# Patient Record
Sex: Male | Born: 1935 | Race: White | Hispanic: No | Smoking: Never smoker
Health system: Southern US, Community
[De-identification: ages and names within clinical notes are randomized; demographics above are authoritative.]

## PROBLEM LIST (undated history)

## (undated) DIAGNOSIS — R972 Elevated prostate specific antigen [PSA]: Secondary | ICD-10-CM

## (undated) DIAGNOSIS — Z87442 Personal history of urinary calculi: Secondary | ICD-10-CM

## (undated) DIAGNOSIS — L989 Disorder of the skin and subcutaneous tissue, unspecified: Secondary | ICD-10-CM

## (undated) DIAGNOSIS — Z860100 Personal history of colon polyps, unspecified: Secondary | ICD-10-CM

## (undated) DIAGNOSIS — I10 Essential (primary) hypertension: Secondary | ICD-10-CM

## (undated) HISTORY — DX: Disorder of the skin and subcutaneous tissue, unspecified: L98.9

## (undated) HISTORY — DX: Essential (primary) hypertension: I10

## (undated) HISTORY — DX: Personal history of urinary calculi: Z87.442

## (undated) HISTORY — DX: Personal history of colonic polyps: Z86.010

## (undated) HISTORY — DX: Personal history of colon polyps, unspecified: Z86.0100

## (undated) HISTORY — DX: Elevated prostate specific antigen (PSA): R97.20

## (undated) HISTORY — PX: COLONOSCOPY: SHX174

## (undated) HISTORY — PX: HERNIA REPAIR: SHX51

---

## 2009-04-28 ENCOUNTER — Ambulatory Visit: Payer: Self-pay | Admitting: Internal Medicine

## 2009-04-28 DIAGNOSIS — Z860100 Personal history of colon polyps, unspecified: Secondary | ICD-10-CM | POA: Insufficient documentation

## 2009-04-28 DIAGNOSIS — N400 Enlarged prostate without lower urinary tract symptoms: Secondary | ICD-10-CM | POA: Insufficient documentation

## 2009-04-28 DIAGNOSIS — Z87442 Personal history of urinary calculi: Secondary | ICD-10-CM | POA: Insufficient documentation

## 2009-04-28 DIAGNOSIS — I10 Essential (primary) hypertension: Secondary | ICD-10-CM | POA: Insufficient documentation

## 2009-04-28 LAB — CONVERTED CEMR LAB
Albumin: 4.4 g/dL (ref 3.5–5.2)
CO2: 28 meq/L (ref 19–32)
Chloride: 104 meq/L (ref 96–112)
Eosinophils Absolute: 0.1 10*3/uL (ref 0.0–0.7)
HDL: 39 mg/dL — ABNORMAL LOW (ref >39)
Hemoglobin: 17.2 g/dL — ABNORMAL HIGH (ref 13.0–17.0)
LDL Cholesterol: 93 mg/dL (ref 0–99)
Lymphs Abs: 2.2 10*3/uL (ref 0.7–4.0)
MCV: 88.9 fL (ref 78.0–100.0)
Monocytes Absolute: 0.6 10*3/uL (ref 0.1–1.0)
Monocytes Relative: 9 % (ref 3–12)
Neutro Abs: 4 10*3/uL (ref 1.7–7.7)
Neutrophils Relative %: 58 % (ref 43–77)
PSA: 4.01 ng/mL — ABNORMAL HIGH (ref 0.10–4.00)
RBC: 5.48 M/uL (ref 4.22–5.81)
Sodium: 139 meq/L (ref 135–145)
Total Bilirubin: 0.6 mg/dL (ref 0.3–1.2)
Total CHOL/HDL Ratio: 4.5
WBC: 7 10*3/uL (ref 4.0–10.5)

## 2009-04-30 ENCOUNTER — Telehealth: Payer: Self-pay | Admitting: Internal Medicine

## 2009-04-30 DIAGNOSIS — R972 Elevated prostate specific antigen [PSA]: Secondary | ICD-10-CM | POA: Insufficient documentation

## 2009-05-04 ENCOUNTER — Encounter: Payer: Self-pay | Admitting: Internal Medicine

## 2009-10-25 ENCOUNTER — Ambulatory Visit: Payer: Self-pay | Admitting: Internal Medicine

## 2009-10-25 DIAGNOSIS — L570 Actinic keratosis: Secondary | ICD-10-CM | POA: Insufficient documentation

## 2009-10-25 DIAGNOSIS — M19049 Primary osteoarthritis, unspecified hand: Secondary | ICD-10-CM | POA: Insufficient documentation

## 2009-10-29 ENCOUNTER — Telehealth: Payer: Self-pay | Admitting: Internal Medicine

## 2009-12-20 ENCOUNTER — Ambulatory Visit: Payer: Self-pay | Admitting: Family

## 2010-01-01 ENCOUNTER — Emergency Department (HOSPITAL_BASED_OUTPATIENT_CLINIC_OR_DEPARTMENT_OTHER): Admission: EM | Admit: 2010-01-01 | Discharge: 2010-01-02 | Payer: Self-pay | Admitting: Emergency Medicine

## 2010-03-09 ENCOUNTER — Ambulatory Visit
Admission: RE | Admit: 2010-03-09 | Discharge: 2010-03-09 | Payer: Self-pay | Source: Home / Self Care | Attending: Internal Medicine | Admitting: Internal Medicine

## 2010-03-09 DIAGNOSIS — R42 Dizziness and giddiness: Secondary | ICD-10-CM | POA: Insufficient documentation

## 2010-04-05 NOTE — Assessment & Plan Note (Signed)
Summary: knuckle hurts/dt   Vital Signs:  Patient profile:   77 year old male Height:      65 inches Weight:      152.25 pounds BMI:     25.43 O2 Sat:      96 % on Room air Temp:     98.1 degrees F oral Pulse rate:   65 / minute Pulse rhythm:   regular Resp:     18 per minute BP sitting:   140 / 80  (right arm) Cuff size:   regular  Vitals Entered By: Glendell Docker CMA (October 25, 2009 3:43 PM)  O2 Flow:  Room air CC: Left index finger knuckle pain Is Patient Diabetic? No Pain Assessment Patient in pain? yes     Location: lef indenx finger Intensity: 9 Type: sharp Onset of pain  Intermittent Comments c/o left index knuckle pain, ongoing several years patient states he has been taking 1/2 tablet of the Lisinopril for the last 8 months   Primary Care Provider:  Dondra Spry DO  CC:  Left index finger knuckle pain.  History of Present Illness:  Hypertension Follow-Up      This is a 77 year old man who presents for Hypertension follow-up.  The patient denies lightheadedness.  The patient denies the following associated symptoms: chest pain.  Compliance with medications (by patient report) has been near 100%.  The patient reports that dietary compliance has been fair.    reviewed prev lipid panel.  Triglycerides elevated he does not drink.  TSH was normal.    not following low cholesterol diet no regular exercise but he stays active.  he mows his lawn walking limited by low back pain  elevated PSA - seen by urologist who recommended ongoing surveillance.    Preventive Screening-Counseling & Management  Alcohol-Tobacco     Smoking Status: never  Allergies: No Known Drug Allergies  Past History:  Past Medical History: Hypertension Colonic polyps, hx of Nephrolithiasis, hx of  (lithotripsy)  Hx of elevated PSA hx of skin lesion right temple - Dr Craige Cotta  Past Surgical History: Cholecystectomy  2009 colonoscopy x 2 - initial showed polyps - f/u colonoscopy  was  Right inguinal hernia repair   PSH reviewed for relevance  Family History: Family History Breast cancer 1st degree relative <50 Family History of CAD Male 1st degree relative - died of MI at age 11 Family History Hypertension Father committed suicide when pt was 4 y/o    Social History: Retired - worked as Music therapist Widowed since 2005 2 daughters, 1 son  (son lives nearby) Never Smoked  Alcohol use-no   Physical Exam  General:  alert, well-developed, and well-nourished.   Neck:  supple, no masses, and no carotid bruits.   Lungs:  normal respiratory effort, normal breath sounds, no crackles, and no wheezes.   Heart:  normal rate, regular rhythm, and no gallop.   Msk:  left hand - OA changes 1 st MC joint, non tender Skin:  scaly area - right temple   Impression & Recommendations:  Problem # 1:  HYPERTENSION (ICD-401.9) Assessment Unchanged BP with manual cuff 132/78.  Maintain current medication regimen.  His updated medication list for this problem includes:    Lisinopril 10 Mg Tabs (Lisinopril) .Marland Kitchen... Take 1/2  tablet by mouth once a day  BP today: 140/80 Prior BP: 134/80 (04/28/2009)  Labs Reviewed: K+: 4.7 (04/28/2009) Creat: : 0.94 (04/28/2009)   Chol: 177 (04/28/2009)   HDL: 39 (04/28/2009)  LDL: 93 (04/28/2009)   TG: 226 (04/28/2009)  Problem # 2:  ACTINIC KERATOSIS (ICD-702.0) He is followed by Dr. Craige Cotta for premalignant lesions.  continue yearly follow up  Orders: Dermatology Referral (Derma)  Problem # 3:  DEGENERATIVE JOINT DISEASE, HANDS (ICD-715.94) pt worked as Music therapist.  he notes intermittent left hand knuckle pain.  he has been using otc aleve as needed.  pt advised to use sparingly we discussed risk of renal insuff. if pain gets worse, pt will consider referral to hand specialist for steroid injection  Problem # 4:  PROSTATE SPECIFIC ANTIGEN, ELEVATED (ICD-790.93) He is followed by urologist who monitors his PSA.    Complete  Medication List: 1)  Lisinopril 10 Mg Tabs (Lisinopril) .... Take 1/2  tablet by mouth once a day 2)  Omega-3 Fish Oil 1000 Mg Caps (Omega-3 fatty acids) .... Take 1 capsule by mouth once a day 3)  Multivitamins Tabs (Multiple vitamin) .... Take 1 tablet by mouth once a day  Patient Instructions: 1)  Please schedule a follow-up appointment in 6 months. 2)  BMP prior to visit, ICD-9: 401.9 3)  Lipid Panel prior to visit, ICD-9: 272.4 4)  TSH prior to visit, ICD-9: 272.4 5)  Please return for lab work one (1) week before your next appointment.    Immunization History:  Zostavax History:    Zostavax # 1:  zostavax (04/30/2009)

## 2010-04-05 NOTE — Progress Notes (Signed)
Summary: Health Questions  ---- Converted from flag ---- ---- 04/30/2009 9:27 AM, D. Thomos Lemons DO wrote: call pt - does he remember his last PSA result.  was he seen by urologist in the past. ------------------------------  Phone Note Outgoing Call   Call placed by: Glendell Docker CMA,  April 30, 2009 10:18 AM Call placed to: Patient Summary of Call: call placed to patient at [phone], no answer, detailed voice message left for patient to return call regarding health questions Initial call taken by: Glendell Docker CMA,  April 30, 2009 10:19 AM  Follow-up for Phone Call        pt. returned Darlene's phone call. I informed pt. Agustin Cree is at lunch and I would tell Darlene to call him back.Michaelle Copas  April 30, 2009 11:58 AM   Additional Follow-up for Phone Call Additional follow up Details #1::        call returned to patient at [phone], no answer, voice message left asking patient to return call regarding urology.  We need to know if he has seen a urologist in the past and if so which one has he seen, and if he would be willing to return to the urologist if he has been seen , if not he will need a referral to a new urologist due to a change in his PSA lab Additional Follow-up by: Glendell Docker CMA,  April 30, 2009 4:38 PM  New Problems: PROSTATE SPECIFIC ANTIGEN, ELEVATED (ICD-790.93)   Additional Follow-up for Phone Call Additional follow up Details #2::    attempted to contact patient at [phone], no answer voice message left for patient to return call Glendell Docker CMA,  May 03, 2009 9:08 AM  patient returned phone call and stated he see Dr Tommie Ard with [facility]. He states that he normally see him once a year. If he needs to follow up, he would like our office to arrange a appointment for him Follow-up by: Glendell Docker CMA,  May 03, 2009 11:46 AM  Additional Follow-up for Phone Call Additional follow up Details #3:: Details for  Additional Follow-up Action Taken: Appt  [facility]   Dr  Cleatrice Burke    sch'd  for March    24th Additional Follow-up by: Darral Dash,  May 05, 2009 12:05 PM  New Problems: PROSTATE SPECIFIC ANTIGEN, ELEVATED (ICD-790.93)

## 2010-04-05 NOTE — Assessment & Plan Note (Signed)
Summary: new to be est, fasting npx- jr   Vital Signs:  Patient profile:   77 year old male Height:      65 inches Weight:      160.75 pounds BMI:     26.85 O2 Sat:      97 % on Room air Temp:     97.7 degrees F oral Pulse rate:   63 / minute Pulse rhythm:   regular Resp:     18 per minute BP sitting:   134 / 80  (left arm) Cuff size:   large  Vitals Entered By: Glendell Docker CMA (April 28, 2009 9:40 AM)  O2 Flow:  Room air CC: New Patient    Primary Care Provider:  Dondra Spry DO  CC:  New Patient .  History of Present Illness: 77 y/o AA male to establish.  Pt has been taking htn meds 12-13 yrs.  BP always higher at doctors office.  usually 120's 130's systolic at home.   prev med caused dizziness.  he does not recall name.   still gets mild dizziness and fatigue no hx of CAD, CVA  He has hx of elevated PSA.       Preventive Screening-Counseling & Management  Alcohol-Tobacco     Alcohol drinks/day: 0     Smoking Status: never  Caffeine-Diet-Exercise     Caffeine use/day: None     Does Patient Exercise: yes     Type of exercise: walking     Times/week: 4  Allergies (verified): No Known Drug Allergies  Past History:  Past Medical History: Hypertension Colonic polyps, hx of Nephrolithiasis, hx of  (lithotripsy)   Past Surgical History: Cholecystectomy  2009 colonoscopy x 2 - initial showed polyps - f/u colonoscopy was  Right inguinal hernia repair   Family History: Family History Breast cancer 1st degree relative <50 Family History of CAD Male 1st degree relative - died of MI at age 39 Family History Hypertension Father committed suicide when pt was 4 y/o  Social History: Retired - worked as Music therapist Widowed since 2005 2 daughters, 1 son  (son lives nearby) Never Smoked Alcohol use-no Smoking Status:  never Caffeine use/day:  None Does Patient Exercise:  yes  Review of Systems  The patient denies anorexia, weight loss, weight  gain, chest pain, syncope, prolonged cough, abdominal pain, melena, hematochezia, severe indigestion/heartburn, difficulty walking, and depression.    Physical Exam  General:  alert, well-developed, and well-nourished.   Head:  normocephalic and atraumatic.   Eyes:  pupils equal, pupils round, and pupils reactive to light.   Ears:  R ear normal and L ear normal.   Mouth:  Oral mucosa and oropharynx without lesions or exudates.   Neck:  supple, no masses, and no carotid bruits.   Lungs:  normal respiratory effort, normal breath sounds, no crackles, and no wheezes.   Heart:  normal rate, regular rhythm, and no gallop.   Abdomen:  soft, non-tender, normal bowel sounds, and no masses.   Extremities:  No lower extremity edema  Neurologic:  cranial nerves II-XII intact and gait normal.   Psych:  normally interactive, good eye contact, not anxious appearing, and not depressed appearing.     Impression & Recommendations:  Problem # 1:  PREVENTIVE HEALTH CARE (ICD-V70.0)  Reviewed adult health maintenance protocols.   Flu Vax: Historical (11/30/2008)     Orders: EKG w/ Interpretation (93000)  Flu Vax: Historical (11/30/2008)     Problem # 2:  HYPERTENSION (  ICD-401.9) Continue lisinopril.  I encouraged low salt diet.  His updated medication list for this problem includes:    Lisinopril 10 Mg Tabs (Lisinopril) .Marland Kitchen... Take 1 tablet by mouth once a day  BP today: 134/80  Orders: T-Basic Metabolic Panel [phone]) T-Hepatic Function [phone]) T-Lipid Profile [phone]) T-CBC w/Diff ([phone])  Complete Medication List: 1)  Lisinopril 10 Mg Tabs (Lisinopril) .... Take 1 tablet by mouth once a day 2)  Omega-3 Fish Oil 1000 Mg Caps (Omega-3 fatty acids) .... Take 1 capsule by mouth once a day 3)  Multivitamins Tabs (Multiple vitamin) .... Take 1 tablet by mouth once a day 4)  Zostavax 95188 Unt/0.38ml Solr (Zoster vaccine live) .... Administer vaccine x 1  Other  Orders: T-PSA ([id number])  Patient Instructions: 1)  Please schedule a follow-up appointment in 6 months. Prescriptions: LISINOPRIL 10 MG TABS (LISINOPRIL) Take 1 tablet by mouth once a day  #90 x 1   Entered and Authorized by:   D. Thomos Lemons DO   Signed by:   D. Thomos Lemons DO on 04/28/2009   Method used:   Electronically to        [facility] # [phone]* (retail)       [address]       [place], [place]  [address]       Ph: [phone]       Fax: [phone]   RxID:   [id number] ZOSTAVAX 19400 UNT/0.65ML SOLR (ZOSTER VACCINE LIVE) administer vaccine x 1  #1 x 0   Entered and Authorized by:   D. Thomos Lemons DO   Signed by:   D. Thomos Lemons DO on 04/28/2009   Method used:   Print then Give to Patient   RxID:   [id number]    Immunization History:  Influenza Immunization History:    Influenza:  historical (11/30/2008)   Current Allergies (reviewed today): No known allergies

## 2010-04-05 NOTE — Letter (Signed)
   [place] at [facility] [address] [place], [place]  [address]  [place] Phone: [phone]      May 06, 2009   Mylo Choi [address] [place], [place] [address]  RE:  LAB RESULTS  Dear  Mr. SAVOCA,  The following is an interpretation of your most recent lab tests.  Please take note of any instructions provided or changes to medications that have resulted from your lab work.  PSA:  high - further testing needed PSA: 4.01  ELECTROLYTES:  Good - no changes needed  KIDNEY FUNCTION TESTS:  Good - no changes needed  LIVER FUNCTION TESTS:  Good - no changes needed  LIPID PANEL:  Fair - review at your next visit Triglyceride: 226   Cholesterol: 177   LDL: 93   HDL: 39   Chol/HDL%:  4.5 Ratio   Our office will contact you regarding referral to urologist.     Sincerely Yours,    Dr.  Thomos Lemons

## 2010-04-05 NOTE — Progress Notes (Signed)
Summary: insurance requires referral   Phone Note From Other Clinic   Caller: [facility] Call For: yoo Summary of Call: This patient's insurance requires a referral.  Please fax to [phone].  Lesion removal and path report  Initial call taken by: Roselle Locus,  October 29, 2009 9:07 AM  Follow-up for Phone Call        see order Follow-up by: D. Thomos Lemons DO,  October 29, 2009 5:09 PM  Additional Follow-up for Phone Call Additional follow up Details #1::        order printed and faxed Roselle Locus  November 01, 2009 7:50 AM

## 2010-04-05 NOTE — Assessment & Plan Note (Signed)
Summary: FLU INJECTION/HEA  Nurse Visit   Vital Signs:  Patient profile:   77 year old male Temp:     97.8 degrees F oral  Vitals Entered By: Mervin Kung CMA Duncan Dull) (December 20, 2009 3:13 PM)  Allergies: No Known Drug Allergies  Orders Added: 1)  Admin 1st Vaccine [90471] 2)  Flu Vaccine 3yrs + [78295]  Flu Vaccine Consent Questions     Do you have a history of severe allergic reactions to this vaccine? no    Any prior history of allergic reactions to egg and/or gelatin? no    Do you have a sensitivity to the preservative Thimersol? no    Do you have a past history of Guillan-Barre Syndrome? no    Do you currently have an acute febrile illness? no    Have you ever had a severe reaction to latex? no    Vaccine information given and explained to patient? yes    Are you currently pregnant? no    Lot Number:[id number]   Exp Date:09/03/2010   Site Given  Left Deltoid IM. Nicki Guadalajara Fergerson CMA Duncan Dull)  December 20, 2009 3:18 PM

## 2010-04-07 NOTE — Assessment & Plan Note (Signed)
Summary: ear ringing dizzy/mhf   Vital Signs:  Patient profile:   77 year old Schultz Height:      65 inches Weight:      153.50 pounds BMI:     25.64 O2 Sat:      98 % on Room air Temp:     97.8 degrees F oral Pulse rate:   65 / minute Resp:     18 per minute BP sitting:   122 / 80  (left arm) Cuff size:   large  Vitals Entered By: Glendell Docker CMA (March 09, 2010 10:22 AM)  O2 Flow:  Room air CC: Ringing in Ears Is Patient Diabetic? No Pain Assessment Patient in pain? no      Comments onset of dizziness about a month ago, was seen in ER, he states that he was suspending his head due to neck problems, stopped about a year ago, and had restarted. Patient was given a rx that he had filled, but has not taken the medication   Primary Care Provider:  Dondra Spry DO  CC:  Ringing in Ears.  History of Present Illness: 77 y/o white Schultz for follow up seen in ER in Oct for vertigo no vomiting heard a "roaring" in his ears (bilateral) no headaches restarted cervical traction which seems to be helping  he has noticed symptoms can start with changes in head position   Preventive Screening-Counseling & Management  Alcohol-Tobacco     Smoking Status: never  Allergies (verified): No Known Drug Allergies  Past History:  Past Medical History: Hypertension Colonic polyps, hx of Nephrolithiasis, hx of  (lithotripsy)  Hx of elevated PSA hx of skin lesion right temple - Dr Craige Cotta   Past Surgical History: Cholecystectomy  2009 colonoscopy x 2 - initial showed polyps - f/u colonoscopy was  Right inguinal hernia repair     Family History: Family History Breast cancer 1st degree relative <50 Family History of CAD Schultz 1st degree relative - died of MI at age 67 Family History Hypertension Father committed suicide when pt was 4 y/o     Social History: Retired - worked as Music therapist Widowed since 2005 2 daughters, 1 son  (son lives nearby) Never Smoked  Alcohol  use-no     Review of Systems  The patient denies vision loss and syncope.    Physical Exam  General:  alert, well-developed, and well-nourished.   Head:  normocephalic and atraumatic.   Eyes:  pupils equal, pupils round, and pupils reactive to light.  no nystagmus Ears:  R ear normal and L ear normal.   Mouth:  pharynx pink and moist.   Neck:  supple, no masses, and no carotid bruits.   Lungs:  normal respiratory effort, normal breath sounds, no crackles, and no wheezes.   Heart:  normal rate, regular rhythm, and no gallop.   Neurologic:  cranial nerves II-XII intact, gait normal, DTRs symmetrical and normal, finger-to-nose normal, and Romberg negative.   Psych:  normally interactive, good eye contact, not anxious appearing, and not depressed appearing.     Impression & Recommendations:  Problem # 1:  INTERMITTENT VERTIGO (ICD-780.4) pt suffered episode of vertigo in Oct of unclear etiology neuro exam is normal probable labrinthitis vs BPV vs low bp  monitor for now  Problem # 2:  HYPERTENSION (ICD-401.9) Assessment: Unchanged pt to monitor BP at home.  if any dizziness with standing, pt advised to take 1/2 of lisinopril dose no orthostasis today  His updated  medication list for this problem includes:    Lisinopril 10 Mg Tabs (Lisinopril) .Marland Kitchen... Take 1  tablet by mouth once a day  BP today: 122/80 Prior BP: 140/80 (10/25/2009)  Labs Reviewed: K+: 4.7 (04/28/2009) Creat: : 0.94 (04/28/2009)   Chol: 177 (04/28/2009)   HDL: 39 (04/28/2009)   LDL: 93 (04/28/2009)   TG: 226 (04/28/2009)  Complete Medication List: 1)  Lisinopril 10 Mg Tabs (Lisinopril) .... Take 1  tablet by mouth once a day 2)  Omega-3 Fish Oil 1000 Mg Caps (Omega-3 fatty acids) .... Take 1 capsule by mouth once a day 3)  Multivitamins Tabs (Multiple vitamin) .... Take 1 tablet by mouth once a day  Patient Instructions: 1)  Please schedule a follow-up appointment in 6 months CPX. 2)  BMP prior to visit,  ICD-9: 401.9 3)  Lipid Panel prior to visit, ICD-9 401.9 4)  High sensitivity CRP :  401.9 5)  Please return for lab work one (1) week before your next appointment.  Prescriptions: LISINOPRIL 10 MG TABS (LISINOPRIL) Take 1  tablet by mouth once a day  #90 x 1   Entered and Authorized by:   D. Thomos Lemons DO   Signed by:   D. Thomos Lemons DO on 03/09/2010   Method used:   Electronically to        [facility] # [phone]* (retail)       [address]       [place], [place]  [address]       Ph: [phone]       Fax: [phone]   RxID:   [id number]    Orders Added: 1)  Est. Patient Level IV [53664]    Current Allergies (reviewed today): No known allergies    Preventive Care Screening  Colonoscopy:    Date:  04/19/2005    Next Due:  04/2010    Results:  normal

## 2010-04-27 ENCOUNTER — Telehealth: Payer: Self-pay | Admitting: Internal Medicine

## 2010-04-28 ENCOUNTER — Ambulatory Visit: Payer: Self-pay | Admitting: Internal Medicine

## 2010-05-03 NOTE — Progress Notes (Signed)
Summary: please schedule lab work prior to cpe  Phone Note Call from Patient   Caller: Patient Call For: nurse Summary of Call: pt called in and made cpe appt for 7.11.12 at 8:30. per emr instructions, please schedule lab work prior bmp 401.9, lipid panel 401.9, high sensitivity CRP 401.9. Initial call taken by: Elba Barman,  April 27, 2010 1:57 PM  Follow-up for Phone Call        blood work entered for [facility] for the first week in July Follow-up by: Glendell Docker CMA,  April 28, 2010 8:45 AM

## 2010-05-18 LAB — BASIC METABOLIC PANEL
GFR calc Af Amer: >60 mL/min (ref >60)
GFR calc non Af Amer: >60 mL/min (ref >60)
Potassium: 4.1 mEq/L (ref 3.5–5.1)
Sodium: 144 mEq/L (ref 135–145)

## 2010-05-18 LAB — DIFFERENTIAL
Basophils Absolute: 0 10*3/uL (ref 0.0–0.1)
Lymphocytes Relative: 31 % (ref 12–46)
Monocytes Absolute: 0.8 10*3/uL (ref 0.1–1.0)
Monocytes Relative: 11 % (ref 3–12)
Neutro Abs: 3.8 10*3/uL (ref 1.7–7.7)

## 2010-05-18 LAB — CBC
HCT: 47.9 % (ref 39.0–52.0)
Hemoglobin: 16.3 g/dL (ref 13.0–17.0)
RDW: 12.9 % (ref 11.5–15.5)
WBC: 6.9 10*3/uL (ref 4.0–10.5)

## 2010-08-26 ENCOUNTER — Encounter: Payer: Self-pay | Admitting: Internal Medicine

## 2010-08-29 ENCOUNTER — Ambulatory Visit: Payer: Self-pay | Admitting: Internal Medicine

## 2010-09-08 ENCOUNTER — Encounter: Payer: Self-pay | Admitting: Internal Medicine

## 2010-09-08 ENCOUNTER — Ambulatory Visit (INDEPENDENT_AMBULATORY_CARE_PROVIDER_SITE_OTHER): Payer: No Typology Code available for payment source | Admitting: Internal Medicine

## 2010-09-08 DIAGNOSIS — Z79899 Other long term (current) drug therapy: Secondary | ICD-10-CM

## 2010-09-08 DIAGNOSIS — Z0000 Encounter for general adult medical examination without abnormal findings: Secondary | ICD-10-CM

## 2010-09-08 DIAGNOSIS — I10 Essential (primary) hypertension: Secondary | ICD-10-CM

## 2010-09-08 DIAGNOSIS — Z13220 Encounter for screening for lipoid disorders: Secondary | ICD-10-CM

## 2010-09-08 MED ORDER — LISINOPRIL 10 MG PO TABS: 10.0000 mg | ORAL_TABLET | Freq: Every day | ORAL | Status: DC

## 2010-09-10 DIAGNOSIS — Z0000 Encounter for general adult medical examination without abnormal findings: Secondary | ICD-10-CM | POA: Insufficient documentation

## 2010-09-10 NOTE — Assessment & Plan Note (Signed)
Nl exam. Obtain cbc, chem7, flp and psa. Pt will check home records for last pneumovax and tetanus.

## 2010-09-10 NOTE — Progress Notes (Signed)
  Subjective:    Patient ID: Derrick Schultz, male    DOB: 02-06-1931, 77 y.o.   MRN: [id number]  HPI Pt presents to clinic for annual exam. H/o minimally elevated psa reportedly s/p urology evaluation with no recent followup. Denies difficult urination or change in urination. Pt unsure of last pneumovax and tetanus but believes has records at home. BP reviewed as nl. Tolerates ace inhibitor without cough or angioedema. Colonoscopy utd. No active complaint.  Reviewed pmh, medications and allergies.    Review of Systems  Constitutional: Negative for fever, chills and fatigue.  Respiratory: Negative for cough and shortness of breath.   Cardiovascular: Negative for chest pain and palpitations.  Genitourinary: Negative for dysuria, hematuria and difficulty urinating.  Neurological: Negative for syncope and weakness.  Hematological: Negative for adenopathy. Does not bruise/bleed easily.  All other systems reviewed and are negative.       Objective:   Physical Exam  Nursing note and vitals reviewed. Constitutional: He appears well-developed and well-nourished. No distress.  HENT:  Head: Normocephalic and atraumatic.  Right Ear: Tympanic membrane, external ear and ear canal normal.  Left Ear: Tympanic membrane, external ear and ear canal normal.  Nose: Nose normal.  Mouth/Throat: Oropharynx is clear and moist. No oropharyngeal exudate.  Eyes: Conjunctivae and EOM are normal. Pupils are equal, round, and reactive to light. Right eye exhibits no discharge. Left eye exhibits no discharge. No scleral icterus.  Neck: Neck supple. No JVD present. Carotid bruit is not present.  Cardiovascular: Normal rate, regular rhythm and normal heart sounds.  Exam reveals no gallop and no friction rub.   No murmur heard. Pulmonary/Chest: Effort normal and breath sounds normal. No respiratory distress. He has no wheezes. He has no rales.  Abdominal: Soft. Bowel sounds are normal. He exhibits no distension  and no mass. There is no tenderness. There is no rebound and no guarding. Hernia confirmed negative in the right inguinal area and confirmed negative in the left inguinal area.  Genitourinary: Testes normal and penis normal. Right testis shows no mass. Left testis shows no mass.  Neurological: He is alert.  Skin: Skin is warm and dry. He is not diaphoretic. No erythema.  Psychiatric: He has a normal mood and affect.          Assessment & Plan:

## 2010-09-12 ENCOUNTER — Telehealth: Payer: Self-pay | Admitting: *Deleted

## 2010-09-12 DIAGNOSIS — Z79899 Other long term (current) drug therapy: Secondary | ICD-10-CM

## 2010-09-12 DIAGNOSIS — I10 Essential (primary) hypertension: Secondary | ICD-10-CM

## 2010-09-12 DIAGNOSIS — Z13220 Encounter for screening for lipoid disorders: Secondary | ICD-10-CM

## 2010-09-12 LAB — CBC WITH DIFFERENTIAL/PLATELET
HCT: 49.3 % (ref 39.0–52.0)
Hemoglobin: 17.2 g/dL — ABNORMAL HIGH (ref 13.0–17.0)
Lymphocytes Relative: 26 % (ref 12–46)
Lymphs Abs: 1.6 10*3/uL (ref 0.7–4.0)
Monocytes Relative: 8 % (ref 3–12)
Neutro Abs: 4.2 10*3/uL (ref 1.7–7.7)
Neutrophils Relative %: 64 % (ref 43–77)
RBC: 5.48 MIL/uL (ref 4.22–5.81)

## 2010-09-12 LAB — BASIC METABOLIC PANEL
BUN: 15 mg/dL (ref 6–23)
CO2: 24 mEq/L (ref 19–32)
Calcium: 10 mg/dL (ref 8.4–10.5)
Chloride: 106 mEq/L (ref 96–112)
Creat: 0.93 mg/dL (ref 0.50–1.35)

## 2010-09-12 LAB — TSH: TSH: 4.422 u[IU]/mL (ref 0.350–4.500)

## 2010-09-12 LAB — LIPID PANEL: LDL Cholesterol: 88 mg/dL (ref 0–99)

## 2010-09-12 NOTE — Telephone Encounter (Signed)
Received call from Katrina needing lab order for bloodwork drawn from pt today. Orders entered and forwarded to the lab per order of 09/08/10.

## 2010-09-14 ENCOUNTER — Encounter: Payer: No Typology Code available for payment source | Admitting: Internal Medicine

## 2010-12-05 ENCOUNTER — Encounter: Payer: Self-pay | Admitting: Internal Medicine

## 2010-12-05 ENCOUNTER — Ambulatory Visit (INDEPENDENT_AMBULATORY_CARE_PROVIDER_SITE_OTHER): Payer: No Typology Code available for payment source | Admitting: Internal Medicine

## 2010-12-05 VITALS — BP 114/64 | HR 60 | Temp 97.8°F | Resp 18 | Ht 66.0 in | Wt 153.0 lb

## 2010-12-05 DIAGNOSIS — L989 Disorder of the skin and subcutaneous tissue, unspecified: Secondary | ICD-10-CM | POA: Insufficient documentation

## 2010-12-05 DIAGNOSIS — Z23 Encounter for immunization: Secondary | ICD-10-CM

## 2010-12-05 NOTE — Assessment & Plan Note (Signed)
Recommend dermatology evaluation given length of time without healing. Consult appt will be made

## 2010-12-05 NOTE — Progress Notes (Signed)
  Subjective:    Patient ID: Derrick Schultz, male    DOB: 1930/08/19, 77 y.o.   MRN: [id number]  HPI Pt presents to clinic for evaluation of non healing skin lesion. Notes small sore on left lower temple that has not healed for at least 2 months. The area has been red without drainage. Has attempted steroid cream without improvement. Reports having seen dermatology ~ q year in the past for surveillance as well as removal of lesions. No other alleviating or exacerbating factors. No other complaints.  Past Medical History  Diagnosis Date  . Hypertension   . History of colonic polyps   . History of nephrolithiasis     lithotripsy  . Elevated PSA     history of elevated   . Skin lesion     of right templs- Dr. Craige Cotta   Past Surgical History  Procedure Date  . Cholecystectomy 2009  . Colonoscopy     x 2 intial showed polyps  . Hernia repair     right inguinal    reports that he has never smoked. He has never used smokeless tobacco. He reports that he does not drink alcohol or use illicit drugs. family history includes Breast cancer in an unspecified family member; Coronary artery disease in an unspecified family member; Hypertension in an unspecified family member; and Suicidality in his father. No Known Allergies     Review of Systems see hpi     Objective:   Physical Exam  Nursing note and vitals reviewed. Constitutional: He appears well-developed and well-nourished. No distress.  HENT:  Head: Normocephalic and atraumatic.  Right Ear: External ear normal.  Left Ear: External ear normal.  Eyes: Conjunctivae are normal. No scleral icterus.  Neurological: He is alert.  Skin: Skin is warm and dry. No rash noted. He is not diaphoretic. There is erythema. No pallor.  Psychiatric: He has a normal mood and affect.       Left temple region of face: small ?SK (~1/2 cm slightly raised hyperpigmented skin lesion). Anterior and inferior to ?SK is small ~1/2cm shallow sore +slight  erythema but no drainage or discharge.          Assessment & Plan:

## 2010-12-19 ENCOUNTER — Telehealth: Payer: Self-pay | Admitting: Internal Medicine

## 2010-12-19 MED ORDER — LISINOPRIL 10 MG PO TABS: 10.0000 mg | ORAL_TABLET | Freq: Every day | ORAL | Status: DC

## 2010-12-19 NOTE — Telephone Encounter (Signed)
Refill- lisinopril 10mg  tab. Take one tablet by mouth every day. Qty 90. Last fill 10.10.12

## 2010-12-19 NOTE — Telephone Encounter (Signed)
Rx refill sent to pharmacy. 

## 2011-03-21 ENCOUNTER — Telehealth: Payer: Self-pay | Admitting: Internal Medicine

## 2011-03-21 MED ORDER — LISINOPRIL 10 MG PO TABS: 10.0000 mg | ORAL_TABLET | Freq: Every day | ORAL | Status: DC

## 2011-03-21 NOTE — Telephone Encounter (Signed)
Rx refill sent to pharmacy. 

## 2011-09-08 ENCOUNTER — Encounter: Payer: No Typology Code available for payment source | Admitting: Internal Medicine

## 2011-09-14 ENCOUNTER — Encounter: Payer: Self-pay | Admitting: Internal Medicine

## 2011-09-14 ENCOUNTER — Ambulatory Visit (INDEPENDENT_AMBULATORY_CARE_PROVIDER_SITE_OTHER): Payer: Medicare Other | Admitting: Internal Medicine

## 2011-09-14 VITALS — BP 122/70 | HR 90 | Temp 97.9°F | Resp 16 | Ht 66.0 in | Wt 149.1 lb

## 2011-09-14 DIAGNOSIS — Z0000 Encounter for general adult medical examination without abnormal findings: Secondary | ICD-10-CM

## 2011-09-14 LAB — HEPATIC FUNCTION PANEL
ALT: 12 U/L (ref 0–53)
Albumin: 4.1 g/dL (ref 3.5–5.2)
Indirect Bilirubin: 0.6 mg/dL (ref 0.0–0.9)
Total Protein: 7.2 g/dL (ref 6.0–8.3)

## 2011-09-14 LAB — LIPID PANEL
Cholesterol: 186 mg/dL (ref 0–200)
HDL: 36 mg/dL — ABNORMAL LOW (ref >39)
Total CHOL/HDL Ratio: 5.2 Ratio
Triglycerides: 157 mg/dL — ABNORMAL HIGH (ref <150)
VLDL: 31 mg/dL (ref 0–40)

## 2011-09-14 LAB — CBC WITH DIFFERENTIAL/PLATELET
Basophils Absolute: 0 10*3/uL (ref 0.0–0.1)
Basophils Relative: 1 % (ref 0–1)
Hemoglobin: 16.9 g/dL (ref 13.0–17.0)
Lymphocytes Relative: 34 % (ref 12–46)
MCHC: 36.2 g/dL — ABNORMAL HIGH (ref 30.0–36.0)
Neutro Abs: 3.5 10*3/uL (ref 1.7–7.7)
Neutrophils Relative %: 53 % (ref 43–77)
RDW: 13.3 % (ref 11.5–15.5)
WBC: 6.6 10*3/uL (ref 4.0–10.5)

## 2011-09-14 LAB — BASIC METABOLIC PANEL
Potassium: 5 mEq/L (ref 3.5–5.3)
Sodium: 138 mEq/L (ref 135–145)

## 2011-09-14 LAB — TSH: TSH: 3.657 u[IU]/mL (ref 0.350–4.500)

## 2011-09-14 NOTE — Progress Notes (Signed)
  Subjective:    Patient ID: Derrick Schultz, male    DOB: 09-23-1930, 78 y.o.   MRN: [id number]  HPI Pt presents to clinic for annual physical. No active complaints. Scheduled to see urology and GI in near future.   Past Medical History  Diagnosis Date  . Hypertension   . History of colonic polyps   . History of nephrolithiasis     lithotripsy  . Elevated PSA     history of elevated   . Skin lesion     of right templs- Dr. Craige Cotta   Past Surgical History  Procedure Date  . Cholecystectomy 2009  . Colonoscopy     x 2 intial showed polyps  . Hernia repair     right inguinal    reports that he has never smoked. He has never used smokeless tobacco. He reports that he does not drink alcohol or use illicit drugs. family history includes Alcohol abuse in his father; Angina in his mother; Breast cancer in an unspecified family member; Cancer in his maternal aunt; Coronary artery disease in an unspecified family member; Hypertension in an unspecified family member; and Suicidality in his father. No Known Allergies   Review of Systems see hpi     Objective:   Physical Exam  Physical Exam  Nursing note and vitals reviewed. Constitutional: He appears well-developed and well-nourished. No distress.  HENT:  Head: Normocephalic and atraumatic.  Right Ear: Tympanic membrane and external ear normal.  Left Ear: Tympanic membrane and external ear normal.  Nose: Nose normal.  Mouth/Throat: Uvula is midline, oropharynx is clear and moist and mucous membranes are normal. No oropharyngeal exudate.  Eyes: Conjunctivae and EOM are normal. Pupils are equal, round, and reactive to light. Right eye exhibits no discharge. Left eye exhibits no discharge. No scleral icterus.  Neck: Neck supple. Carotid bruit is not present. No thyromegaly present.  Cardiovascular: Normal rate, regular rhythm and normal heart sounds.  Exam reveals no gallop and no friction rub.   No murmur heard. Pulmonary/Chest:  Effort normal and breath sounds normal. No respiratory distress. He has no wheezes. He has no rales.  Abdominal: Soft. He exhibits no distension and no mass. There is no hepatosplenomegaly. There is no tenderness. There is no rebound. Hernia confirmed negative in the right inguinal area and confirmed negative in the left inguinal area.  Genitourinary:  testes normal.Right testis shows no mass, no swelling and no tenderness. Right testis is descended. Left testis shows no mass, no swelling and no tenderness. Left testis is descended.  Lymphadenopathy:    He has no cervical adenopathy.       Right: No inguinal adenopathy present.       Left: No inguinal adenopathy present.  Neurological: He is alert.  Skin: Skin is warm and dry. He is not diaphoretic.  Psychiatric: He has a normal mood and affect.        Assessment & Plan:

## 2011-09-14 NOTE — Assessment & Plan Note (Signed)
Nl exam. Obtain cpe labs. psa and dre per urology. EKG demonstrates SB 58 with nl intervals. Possible rightward axis. Acute ischemic changes

## 2011-09-15 LAB — URINALYSIS, ROUTINE W REFLEX MICROSCOPIC
Glucose, UA: NEGATIVE mg/dL
Hgb urine dipstick: NEGATIVE
Leukocytes, UA: NEGATIVE
Protein, ur: NEGATIVE mg/dL

## 2011-09-19 ENCOUNTER — Telehealth: Payer: Self-pay | Admitting: Internal Medicine

## 2011-09-19 MED ORDER — LISINOPRIL 10 MG PO TABS: 10.0000 mg | ORAL_TABLET | Freq: Every day | ORAL | Status: DC

## 2011-09-19 NOTE — Telephone Encounter (Signed)
Done

## 2012-03-21 ENCOUNTER — Other Ambulatory Visit: Payer: Self-pay | Admitting: *Deleted

## 2012-03-21 MED ORDER — LISINOPRIL 10 MG PO TABS: 10.0000 mg | ORAL_TABLET | Freq: Every day | ORAL | Status: DC

## 2012-03-21 NOTE — Progress Notes (Signed)
Per patient request, Rx to pharmacy/SLS

## 2012-07-04 ENCOUNTER — Emergency Department (HOSPITAL_BASED_OUTPATIENT_CLINIC_OR_DEPARTMENT_OTHER)
Admission: EM | Admit: 2012-07-04 | Discharge: 2012-07-04 | Disposition: A | Discharge status: Home / Self Care | Payer: Medicare Other | Attending: Emergency Medicine | Admitting: Emergency Medicine

## 2012-07-04 ENCOUNTER — Encounter (HOSPITAL_BASED_OUTPATIENT_CLINIC_OR_DEPARTMENT_OTHER): Payer: Self-pay

## 2012-07-04 DIAGNOSIS — I10 Essential (primary) hypertension: Secondary | ICD-10-CM | POA: Insufficient documentation

## 2012-07-04 DIAGNOSIS — S30860A Insect bite (nonvenomous) of lower back and pelvis, initial encounter: Secondary | ICD-10-CM | POA: Insufficient documentation

## 2012-07-04 DIAGNOSIS — Z87442 Personal history of urinary calculi: Secondary | ICD-10-CM | POA: Insufficient documentation

## 2012-07-04 DIAGNOSIS — Z872 Personal history of diseases of the skin and subcutaneous tissue: Secondary | ICD-10-CM | POA: Insufficient documentation

## 2012-07-04 DIAGNOSIS — Z860100 Personal history of colon polyps, unspecified: Secondary | ICD-10-CM | POA: Insufficient documentation

## 2012-07-04 DIAGNOSIS — Y929 Unspecified place or not applicable: Secondary | ICD-10-CM | POA: Insufficient documentation

## 2012-07-04 DIAGNOSIS — Y9389 Activity, other specified: Secondary | ICD-10-CM | POA: Insufficient documentation

## 2012-07-04 DIAGNOSIS — Z79899 Other long term (current) drug therapy: Secondary | ICD-10-CM | POA: Insufficient documentation

## 2012-07-04 DIAGNOSIS — W57XXXA Bitten or stung by nonvenomous insect and other nonvenomous arthropods, initial encounter: Secondary | ICD-10-CM | POA: Insufficient documentation

## 2012-07-04 DIAGNOSIS — Z7982 Long term (current) use of aspirin: Secondary | ICD-10-CM | POA: Insufficient documentation

## 2012-07-04 MED ORDER — DOXYCYCLINE HYCLATE 100 MG PO CAPS: 100.0000 mg | ORAL_CAPSULE | Freq: Two times a day (BID) | ORAL | Status: DC

## 2012-07-04 NOTE — ED Provider Notes (Signed)
History     CSN: [id number]  Arrival date & time 07/04/12  1759   First MD Initiated Contact with Patient 07/04/12 1822      Chief Complaint  Patient presents with  . Tick Removal    (Consider location/radiation/quality/duration/timing/severity/associated sxs/prior treatment) HPI Comments: Pt has a tick on his left back.  Pt reports he can not get out.  Pt has swelling around tick  The history is provided by the patient. No language interpreter was used.    Past Medical History  Diagnosis Date  . Hypertension   . History of colonic polyps   . History of nephrolithiasis     lithotripsy  . Elevated PSA     history of elevated   . Skin lesion     of right templs- Dr. Craige Cotta    Past Surgical History  Procedure Laterality Date  . Colonoscopy      x 2 intial showed polyps  . Hernia repair      right inguinal    Family History  Problem Relation Age of Onset  . Breast cancer    . Coronary artery disease      male 1st degree relative died of MI age 11  . Hypertension    . Suicidality Father     father committed suicide whe pt was 4 y/o  . Alcohol abuse Father   . Angina Mother   . Cancer Maternal Aunt     History  Substance Use Topics  . Smoking status: Never Smoker   . Smokeless tobacco: Never Used  . Alcohol Use: No      Review of Systems  Skin: Positive for wound.  All other systems reviewed and are negative.    Allergies  Review of patient's allergies indicates no known allergies.  Home Medications   Current Outpatient Rx  Name  Route  Sig  Dispense  Refill  . aspirin 81 MG tablet   Oral   Take 81 mg by mouth daily.         Marland Kitchen doxycycline (VIBRAMYCIN) 100 MG capsule   Oral   Take 1 capsule (100 mg total) by mouth 2 (two) times daily.   20 capsule   0   . lisinopril (PRINIVIL,ZESTRIL) 10 MG tablet   Oral   Take 1 tablet (10 mg total) by mouth daily.   90 tablet   1   . Multiple Vitamin (MULTIVITAMIN) tablet   Oral   Take 1 tablet  by mouth daily.           Marland Kitchen omega-3 fish oil (MAXEPA) 1000 MG CAPS capsule   Oral   Take 1 capsule by mouth daily.             BP 135/71  Pulse 63  Temp(Src) 98 F (36.7 C) (Oral)  Resp 16  Ht 5\' 7"  (1.702 m)  Wt 145 lb (65.772 kg)  BMI 22.71 kg/m2  SpO2 97%  Physical Exam  Nursing note and vitals reviewed. Constitutional: He appears well-developed and well-nourished.  HENT:  Head: Normocephalic.  Musculoskeletal: He exhibits tenderness.  Tick embedded left side of back  Skin: Skin is warm.  Psychiatric: He has a normal mood and affect.    ED Course  Procedures (including critical care time)  Labs Reviewed - No data to display No results found.   1. Tick bite     Tick removed with suture removal tweezers  MDM  doxycline        Lonia Skinner  [place], PA-C 07/04/12 1929  Elson Areas, PA-C 07/04/12 1930

## 2012-07-04 NOTE — ED Notes (Signed)
C/o tick to left flank-first noticed today-attempted removal at home

## 2012-07-06 NOTE — ED Provider Notes (Signed)
Medical screening examination/treatment/procedure(s) were performed by non-physician practitioner and as supervising physician I was immediately available for consultation/collaboration.    Nashley Cordoba L Nylen Creque, MD 07/06/12 1550 

## 2012-08-02 ENCOUNTER — Ambulatory Visit (INDEPENDENT_AMBULATORY_CARE_PROVIDER_SITE_OTHER): Payer: Medicare Other | Admitting: Family

## 2012-08-02 ENCOUNTER — Encounter: Payer: Self-pay | Admitting: Family

## 2012-08-02 ENCOUNTER — Telehealth: Payer: Self-pay | Admitting: *Deleted

## 2012-08-02 VITALS — BP 126/78 | HR 60 | Temp 97.8°F | Resp 16 | Ht 66.0 in | Wt 151.0 lb

## 2012-08-02 DIAGNOSIS — M545 Low back pain, unspecified: Secondary | ICD-10-CM

## 2012-08-02 DIAGNOSIS — M79605 Pain in left leg: Secondary | ICD-10-CM

## 2012-08-02 MED ORDER — MELOXICAM 7.5 MG PO TABS: 7.5000 mg | ORAL_TABLET | Freq: Every day | ORAL | Status: DC

## 2012-08-02 NOTE — Assessment & Plan Note (Signed)
Trial of meloxicam.  Declines imaging at this time.  Pt to call if symptoms worsen or if symptoms do not improve.

## 2012-08-02 NOTE — Telephone Encounter (Signed)
eRx transmission failed on pt's meloxicam rx. Rx called to pharmacist.

## 2012-08-02 NOTE — Progress Notes (Signed)
  Subjective:    Patient ID: Derrick Schultz, male    DOB: Jul 09, 1930, 79 y.o.   MRN: [id number]  HPI  Derrick Schultz is a 79 yr old male who presents today with chief complaint of low back pain.  Reports that last week he developed tingling in the left anterior leg. Tingling is worse with ambulation. Reports associated low back pain.  He has not taken any medication.  Reports remote hx of back injury and some chronic low back pain.  He denies associated left leg weakness.    Review of Systems    see HPI  Past Medical History  Diagnosis Date  . Hypertension   . History of colonic polyps   . History of nephrolithiasis     lithotripsy  . Elevated PSA     history of elevated   . Skin lesion     of right templs- Dr. Craige Cotta    History   Social History  . Marital Status: Widowed    Spouse Name: N/A    Number of Children: 3  . Years of Education: N/A   Occupational History  .     Social History Main Topics  . Smoking status: Never Smoker   . Smokeless tobacco: Never Used  . Alcohol Use: No  . Drug Use: No  . Sexually Active: Not on file   Other Topics Concern  . Not on file   Social History Narrative   Retired - worked as Music therapist   Widowed since 2005   2 daughters, 1 son  (son lives nearby)   Never Smoked    Alcohol use-no     Past Surgical History  Procedure Laterality Date  . Colonoscopy      x 2 intial showed polyps  . Hernia repair      right inguinal    Family History  Problem Relation Age of Onset  . Breast cancer    . Coronary artery disease      male 1st degree relative died of MI age 64  . Hypertension    . Suicidality Father     father committed suicide whe pt was 4 y/o  . Alcohol abuse Father   . Angina Mother   . Cancer Maternal Aunt     No Known Allergies  Current Outpatient Prescriptions on File Prior to Visit  Medication Sig Dispense Refill  . aspirin 81 MG tablet Take 81 mg by mouth daily.      Marland Kitchen lisinopril (PRINIVIL,ZESTRIL) 10  MG tablet Take 1 tablet (10 mg total) by mouth daily.  90 tablet  1   No current facility-administered medications on file prior to visit.    BP 126/78  Pulse 60  Temp(Src) 97.8 F (36.6 C) (Oral)  Resp 16  Ht 5\' 6"  (1.676 m)  Wt 151 lb (68.493 kg)  BMI 24.38 kg/m2  SpO2 99%    Objective:   Physical Exam  Constitutional: He is oriented to person, place, and time. He appears well-developed and well-nourished. No distress.  Musculoskeletal:  Bilateral LE strength 5/5 1+ bilateral patellar reflexes.   Neurological: He is alert and oriented to person, place, and time.  Psychiatric: He has a normal mood and affect. His behavior is normal. Judgment and thought content normal.  skin:  Small area of induration left flank (had tick bite a few weeks back)        Assessment & Plan:

## 2012-08-02 NOTE — Patient Instructions (Addendum)
Call if symptoms worsen or if symptoms are not improved in 1 week.

## 2012-08-12 ENCOUNTER — Telehealth: Payer: Self-pay | Admitting: *Deleted

## 2012-08-12 DIAGNOSIS — M79605 Pain in left leg: Secondary | ICD-10-CM

## 2012-08-12 MED ORDER — MELOXICAM 7.5 MG PO TABS: 7.5000 mg | ORAL_TABLET | Freq: Every day | ORAL | Status: DC

## 2012-08-12 NOTE — Telephone Encounter (Signed)
OK to send 1 refill but pls advise pt that he should not be on this long term due to risk of GI and kidney problems.  He should try to transition to tylenol as soon as possible.

## 2012-08-12 NOTE — Telephone Encounter (Signed)
Notified pt and he voices understanding. Rx sent. 

## 2012-08-12 NOTE — Telephone Encounter (Signed)
Received message from pt stating meloxicam has been helping and he would like to get a refill.  Please advise.

## 2012-08-27 ENCOUNTER — Encounter: Payer: Self-pay | Admitting: Family

## 2012-09-13 ENCOUNTER — Ambulatory Visit: Payer: Medicare Other | Admitting: Internal Medicine

## 2012-09-19 ENCOUNTER — Other Ambulatory Visit: Payer: Self-pay | Admitting: Internal Medicine

## 2012-09-20 ENCOUNTER — Other Ambulatory Visit: Payer: Self-pay | Admitting: Internal Medicine

## 2012-09-20 NOTE — Telephone Encounter (Signed)
Denied-Duplicate--Rx sent to pharmacy 07.17.14, #[id number]/SLS

## 2012-09-24 ENCOUNTER — Ambulatory Visit (HOSPITAL_BASED_OUTPATIENT_CLINIC_OR_DEPARTMENT_OTHER)
Admission: RE | Admit: 2012-09-24 | Discharge: 2012-09-24 | Disposition: A | Discharge status: Home / Self Care | Payer: BC Managed Care – HMO | Source: Ambulatory Visit | Attending: Physician Assistant | Admitting: Physician Assistant

## 2012-09-24 ENCOUNTER — Encounter: Payer: Self-pay | Admitting: Physician Assistant

## 2012-09-24 ENCOUNTER — Ambulatory Visit (INDEPENDENT_AMBULATORY_CARE_PROVIDER_SITE_OTHER): Payer: Medicare Other | Admitting: Physician Assistant

## 2012-09-24 VITALS — BP 122/70 | HR 71 | Temp 98.2°F | Ht 66.0 in | Wt 151.0 lb

## 2012-09-24 DIAGNOSIS — M19049 Primary osteoarthritis, unspecified hand: Secondary | ICD-10-CM

## 2012-09-24 DIAGNOSIS — M545 Low back pain, unspecified: Secondary | ICD-10-CM

## 2012-09-24 DIAGNOSIS — Z0000 Encounter for general adult medical examination without abnormal findings: Secondary | ICD-10-CM

## 2012-09-24 DIAGNOSIS — M51379 Other intervertebral disc degeneration, lumbosacral region without mention of lumbar back pain or lower extremity pain: Secondary | ICD-10-CM | POA: Insufficient documentation

## 2012-09-24 DIAGNOSIS — Q762 Congenital spondylolisthesis: Secondary | ICD-10-CM | POA: Insufficient documentation

## 2012-09-24 DIAGNOSIS — M5137 Other intervertebral disc degeneration, lumbosacral region: Secondary | ICD-10-CM | POA: Insufficient documentation

## 2012-09-24 DIAGNOSIS — N138 Other obstructive and reflux uropathy: Secondary | ICD-10-CM

## 2012-09-24 DIAGNOSIS — I10 Essential (primary) hypertension: Secondary | ICD-10-CM

## 2012-09-24 DIAGNOSIS — N401 Enlarged prostate with lower urinary tract symptoms: Secondary | ICD-10-CM

## 2012-09-24 DIAGNOSIS — M79605 Pain in left leg: Secondary | ICD-10-CM

## 2012-09-24 LAB — LIPID PANEL
Cholesterol: 175 mg/dL (ref 0–200)
HDL: 35 mg/dL — ABNORMAL LOW (ref >39)
LDL Cholesterol: 107 mg/dL — ABNORMAL HIGH (ref 0–99)
Total CHOL/HDL Ratio: 5 Ratio

## 2012-09-24 LAB — BASIC METABOLIC PANEL
Potassium: 4.3 mEq/L (ref 3.5–5.3)
Sodium: 138 mEq/L (ref 135–145)

## 2012-09-24 NOTE — Patient Instructions (Addendum)
Please obtain labs after check-out.  Imaging is downstairs on 1st floor of building.  Will call you with labs/imaging results.  Think about letting us add a medication for relief of tingling.  Im very glad you are doing well.  Please return to clinic as soon as you need [place].   Back Pain, Adult Back pain is very common. The pain often gets better over time. The cause of back pain is usually not dangerous. Most people can learn to manage their back pain on their own.  HOME CARE   Stay active. Start with short walks on flat ground if you can. Try to walk farther each day.  Do not sit, drive, or stand in one place for more than 30 minutes. Do not stay in bed.  Do not avoid exercise or work. Activity can help your back heal faster.  Be careful when you bend or lift an object. Bend at your knees, keep the object close to you, and do not twist.  Sleep on a firm mattress. Lie on your side, and bend your knees. If you lie on your back, put a pillow under your knees.  Only take medicines as told by your doctor.  Put ice on the injured area.  Put ice in a plastic bag.  Place a towel between your skin and the bag.  Leave the ice on for 15-20 minutes, 3-4 times a day for the first 2 to 3 days. After that, you can switch between ice and heat packs.  Ask your doctor about back exercises or massage.  Avoid feeling anxious or stressed. Find good ways to deal with stress, such as exercise. GET HELP RIGHT AWAY IF:   Your pain does not go away with rest or medicine.  Your pain does not go away in 1 week.  You have new problems.  You do not feel well.  The pain spreads into your legs.  You cannot control when you poop (bowel movement) or pee (urinate).  Your arms or legs feel weak or lose feeling (numbness).  You feel sick to your stomach (nauseous) or throw up (vomit).  You have belly (abdominal) pain.  You feel like you may pass out (faint). MAKE SURE YOU:   Understand these  instructions.  Will watch your condition.  Will get help right away if you are not doing well or get worse. Document Released: 08/09/2007 Document Revised: 05/15/2011 Document Reviewed: 07/11/2010 [facility] Patient Information 2014 [place], [place].   Aspirin and Your Heart Aspirin affects the way your blood clots and helps "thin" the blood. Aspirin has many uses in heart disease. It may be used as a primary prevention to help reduce the risk of heart related events. It also can be used as a secondary measure to prevent more heart attacks or to prevent additional damage from blood clots.  ASPIRIN MAY HELP IF YOU:  Have had a heart attack or chest pain.  Have undergone open heart surgery such as CABG (Coronary Artery Bypass Surgery).  Have had coronary angioplasty with or without stents.  Have experienced a stroke or TIA (transient ischemic attack).  Have peripheral vascular disease (PAD).  Have chronic heart rhythm problems such as atrial fibrillation.  Are at risk for heart disease. BEFORE STARTING ASPIRIN Before you start taking aspirin, your caregiver will need to review your medical history. Many things will need to be taken into consideration, such as:  Smoking status.  Blood pressure.  Diabetes.  Gender.  Weight.  Cholesterol level.  ASPIRIN DOSES  Aspirin should only be taken on the advice of your caregiver. Talk to your caregiver about how much aspirin you should take. Aspirin comes in different doses such as:  81 mg.  162 mg.  325 mg.  The aspirin dose you take may be affected by many factors, some of which include:  Your current medications, especially if your are taking blood-thinners or anti-platelet medicine.  Liver function.  Heart disease risk.  Age.  Aspirin comes in two forms:  Non-enteric-coated. This type of aspirin does not have a coating and is absorbed faster. Non-enteric coated aspirin is recommended for patients experiencing chest  pain symptoms. This type of aspirin also comes in a chewable form.  Enteric-coated. This means the aspirin has a special coating that releases the medicine very slowly. Enteric-coated aspirin causes less stomach upset. This type of aspirin should not be chewed or crushed. ASPIRIN SIDE EFFECTS Daily use of aspirin can increase your risk of serious side effects, some of these include:  Increased bleeding. This can range from a cut that does not stop bleeding to more serious problems such as stomach bleeding or bleeding into the brain (Intracerebral bleeding).  Increased bruising.  Stomach upset.  An allergic reaction such as red, itchy skin.  Increased risk of bleeding when combined with non-steroidal anti-inflammatory medicine (NSAIDS).  Alcohol should be drank in moderation when taking aspirin. Alcohol can increase the risk of stomach bleeding when taken with aspirin.  Aspirin should not be given to children less than 48 years of age due to the association of Reye syndrome. Reye syndrome is a serious illness that can affect the brain and liver. Studies have linked Reye syndrome with aspirin use in children.  People that have nasal polyps have an increased risk of developing an aspirin allergy. SEEK MEDICAL CARE IF:   You develop an allergic reaction such as:  Hives.  Itchy skin.  Swelling of the lips, tongue or face.  You develop stomach pain.  You have unusual bleeding or bruising.  You have ringing in your ears. SEEK IMMEDIATE MEDICAL CARE IF:   You have severe chest pain, especially if the pain is crushing or pressure-like and spreads to the arms, back, neck, or jaw. THIS IS AN EMERGENCY. Do not wait to see if the pain will go away. Get medical help at once. Call your local emergency services (911 in the [place] DO NOT drive yourself to the hospital.  You have stroke-like symptoms such as:  Loss of vision.  Difficulty talking.  Numbness or weakness on one side of your  body.  Numbness or weakness in your arm or leg.  Not thinking clearly or feeling confused.  Your bowel movements are bloody, dark red or black in color.  You vomit or cough up blood.  You have blood in your urine.  You have shortness of breath, coughing or wheezing. MAKE SURE YOU:   Understand these instructions.  Will monitor your condition.  Seek immediate medical care if necessary. Document Released: 02/03/2008 Document Revised: 05/15/2011 Document Reviewed: 02/03/2008 [facility] Patient Information 2014 [place], [place].

## 2012-09-24 NOTE — Assessment & Plan Note (Signed)
Patient is doing well today.  Will obtain fasting labs for preventive care to include CBC, BMP, TSH, Lipid Panel.  Will inform patient of lab results.

## 2012-09-24 NOTE — Assessment & Plan Note (Signed)
Patient BP stable in clinic today.  Seems BP is well controlled with Lisinopril.  Patient to continue Lisinopril as prescribed.

## 2012-09-24 NOTE — Progress Notes (Signed)
Patient ID: Derrick Schultz, male   DOB: Sep 16, 1930, 79 y.o.   MRN: [id number]  Subjective:   Patient here for Medicare annual wellness visit and management of other chronic and acute problems.    Acute Problems  (1) Low back pain w/ tingling in LLE -- patient states that he has had intermittent tingling in his LLE that is associated with LBP.  This has been going on for several months now.  Has a history of injury to lower back in the past year.  Has seen ED for this problem before as well as PCP, but declined imaging at that time.  Was started on trial of Meloxicam which he states was helpful but the pain is not constant or severe enough for him to take medication for it.  He denies any weakness to lower extremities.  Denies numbness or saddle anesthesia.  Denies any recent trauma to back or lower extremities.  Denies pain or tingling of RLE.  Patient is interested in Xray at this time due to persistence of symptoms.   Chronic Problems  (1) Hypertension -- BP good in clinic today. Taking Lisinopril as prescribed.  No symptoms of HTN or low BP  (2) Degenerative Joint Disease - Hands -- patient not experiencing pain over the past few weeks.  (3) BPH -- sees urologist.  No symptoms at present.  Risk factors: HTN   Roster of Physicians Providing Medical Care to Patient: Dr. Cleatrice Burke -- Urologist Dr. Norma Fredrickson -- GI  Activities of Daily Living  In your present state of health, do you have any difficulty performing the following activities? Preparing food and eating?: No  Bathing yourself: No  Getting dressed: No  Using the toilet:No  Moving around from place to place: No  In the past year have you fallen or had a near fall?:No     Home Safety: Has smoke detector and wears seat belts. Has firearms locked in gun safe at home. No excess sun exposure; wears straw hat and appropriate clothing when outside. Applies sunscreen daily.  Diet and Exercise  Current exercise habits: walks daily Dietary  issues discussed: healthy diet -- plenty of fruits and vegetables; good protein intake  Depression Screen  (Note: if answer to either of the following is "Yes", then a more complete depression screening is indicated)  Q1: Over the past two weeks, have you felt down, depressed or hopeless?no  Q2: Over the past two weeks, have you felt little interest or pleasure in doing things? no   The following portions of the patient's history were reviewed and updated as appropriate: allergies, current medications, past family history, past medical history, past social history, past surgical history and problem list.    Review of Systems  Review of Systems  Constitutional: Negative.   HENT: Negative for hearing loss.   Eyes: Negative for blurred vision and double vision.  Respiratory: Negative.   Cardiovascular: Negative for chest pain, palpitations and claudication.  Genitourinary: Negative.   Musculoskeletal: Positive for back pain and joint pain. Negative for myalgias.  Skin: Negative.   Neurological: Negative for dizziness and headaches.       Tingling in LLE  Psychiatric/Behavioral: Negative.     Objective:   Vision: Right eye -- 20/30; Left Eye 20/30; Both eyes -- 20/20. All with corrective lenses. Hearing: can hear a forced whisper from >6 ft away Body mass index: Body mass index is 24.38 kg/(m^2).  Cognitive Impairment Assessment: cognition, memory and judgment appear normal.   BP 122/70  Pulse 71  Temp(Src) 98.2 F (36.8 C) (Oral)  Ht 5\' 6"  (1.676 m)  Wt 151 lb (68.493 kg)  BMI 24.38 kg/m2  SpO2 96%  General Appearance:    Alert, cooperative, no distress, appears stated age  Head:    Normocephalic, without obvious abnormality, atraumatic  Eyes:    PERRL, conjunctiva/corneas clear, EOM's intact, fundi    benign, both eyes       Ears:    Normal TM's and external ear canals, both ears  Nose:   Nares normal, septum midline, mucosa normal, no drainage   or sinus tenderness   Throat:   Lips, mucosa, and tongue normal; teeth and gums normal  Neck:   Supple, symmetrical, trachea midline, no adenopathy;       thyroid:  No enlargement/tenderness/nodules; no carotid   bruit or JVD  Back:     Symmetric, no curvature, ROM normal, no CVA tenderness  Lungs:     Clear to auscultation bilaterally, respirations unlabored  Chest wall:    No tenderness or deformity  Heart:    Regular rate and rhythm, S1 and S2 normal, no murmur, rub   or gallop  Abdomen:     Soft, non-tender, bowel sounds active all four quadrants,    no masses, no organomegaly        Extremities:   Extremities normal, atraumatic, no cyanosis or edema  Pulses:   2+ and symmetric all extremities  Skin:   Skin color, texture, turgor normal, no rashes or lesions  Lymph nodes:   Cervical, supraclavicular, and axillary nodes normal  Neurologic:   CNII-XII intact. Normal strength, sensation and reflexes      throughout   Assessment:   Medicare wellness utd on preventive parameters   (1) Hypertension --- well controlled with Lisinopril.  (2) DJD of Hands ---.no recent pain in hands.    (3) BPH -- asymptomatic. Plan:    (1) HTN -- Patient instructed to take medications as prescribed.  No changes needed today.  (2) DJD ---   patient to take Aleve as needed for symptomatic relief.  Call if pain were to become more bothersome  (3) BPH --- follow-up with Urology if symptoms return.  (4) Low back pain -- plan to obtain XRay of lumbar spine today.  Will call patient with results and tailor treatment as necessary.  Patient still wishes to tolerate pain instead of initiating pharmacological management  (5) Annual Visit -- obtaining preventive labs today -- CBC, BMP, TSH, Lipid Panel.  Will call patient with results  During the course of the visit the patient was educated and counseled about appropriate screening and preventive services including:       Fall prevention      Depression      Home  Safety      Patient Instructions (the written plan) was given to the patient.

## 2012-09-24 NOTE — Assessment & Plan Note (Signed)
Patient doing well.  Continue taking aleve as needed for intermittent symptoms.

## 2012-09-24 NOTE — Assessment & Plan Note (Signed)
Patient is asymptomatic.  Continue seeing Urology as needed.

## 2012-09-24 NOTE — Assessment & Plan Note (Signed)
Tingling sensation of LLE and LBP have persisted since last visit.  3-4 months since onset of symptoms.  Patient is willing to get imaging.  Plan to obtain Xray of Lumbar Spine.  Will inform patient of results.  Will schedule return visit if necessary.

## 2012-09-25 ENCOUNTER — Telehealth: Payer: Self-pay | Admitting: Physician Assistant

## 2012-09-25 LAB — CBC WITH DIFFERENTIAL/PLATELET
Eosinophils Relative: 3 % (ref 0–5)
HCT: 48.2 % (ref 39.0–52.0)
Lymphocytes Relative: 29 % (ref 12–46)
Lymphs Abs: 2.1 10*3/uL (ref 0.7–4.0)
MCV: 88.1 fL (ref 78.0–100.0)
Platelets: 186 10*3/uL (ref 150–400)
RBC: 5.47 MIL/uL (ref 4.22–5.81)
WBC: 7.3 10*3/uL (ref 4.0–10.5)

## 2012-09-25 LAB — TSH: TSH: 3.664 u[IU]/mL (ref 0.350–4.500)

## 2012-09-25 NOTE — Telephone Encounter (Signed)
Attempted to call patient with results x2. Will try once more at a later time.

## 2012-12-24 ENCOUNTER — Telehealth: Payer: Self-pay | Admitting: *Deleted

## 2012-12-24 MED ORDER — LISINOPRIL 10 MG PO TABS: ORAL_TABLET | ORAL | Status: DC

## 2012-12-24 NOTE — Telephone Encounter (Signed)
Received call from pt requesting refill of lisinopril. Refill sent. Pt last seen 09/24/12 for medicare wellness exam. Pt has no future appointments on file. When do you want to see pt back in the office?

## 2012-12-24 NOTE — Telephone Encounter (Signed)
He should be seen for a 6 month follow up please.

## 2012-12-25 NOTE — Telephone Encounter (Signed)
Informed patient of medication refill and he scheduled appointment for 03/26/13

## 2012-12-25 NOTE — Telephone Encounter (Signed)
Please call pt to arrange.

## 2013-01-10 ENCOUNTER — Ambulatory Visit (INDEPENDENT_AMBULATORY_CARE_PROVIDER_SITE_OTHER): Payer: Medicare Other

## 2013-01-10 DIAGNOSIS — Z23 Encounter for immunization: Secondary | ICD-10-CM

## 2013-03-26 ENCOUNTER — Encounter: Payer: Self-pay | Admitting: Physician Assistant

## 2013-03-26 ENCOUNTER — Ambulatory Visit: Payer: Medicare Other | Admitting: Family

## 2013-03-26 ENCOUNTER — Ambulatory Visit (INDEPENDENT_AMBULATORY_CARE_PROVIDER_SITE_OTHER): Payer: Managed Care, Other (non HMO) | Admitting: Physician Assistant

## 2013-03-26 VITALS — BP 128/68 | HR 64 | Temp 97.4°F | Resp 16 | Ht 66.0 in | Wt 155.1 lb

## 2013-03-26 DIAGNOSIS — E785 Hyperlipidemia, unspecified: Secondary | ICD-10-CM

## 2013-03-26 DIAGNOSIS — I10 Essential (primary) hypertension: Secondary | ICD-10-CM

## 2013-03-26 MED ORDER — LISINOPRIL 10 MG PO TABS: ORAL_TABLET | ORAL | Status: DC

## 2013-03-26 NOTE — Patient Instructions (Signed)
Please continue medications as prescribed.  Start taking either a fish oil or krill oil supplement to help lower your cholesterol.  Also try to walk daily for 30 minutes.  Avoid foods high in cholesterol and saturated fats.  We will recheck your cholesterol in 6 months when you come in for this year's complete physical. Please schedule an appointment with the ladies at the front desk.  It was a pleasure participating in your care today.

## 2013-03-26 NOTE — Progress Notes (Signed)
Patient presents to clinic today for 43-month follow-up for hypertension.  Patient endorses taking his lisinopril as directed daily. BP is 128/68 in clinic today. Patient denies chest pain, palpitations, headaches, vision changes, lightheadedness, dizziness.  Denies cough with ACEI therapy. Checks BP at home and endorses normotensive blood pressures at home.  Past Medical History  Diagnosis Date  . Hypertension   . History of colonic polyps   . History of nephrolithiasis     lithotripsy  . Elevated PSA     history of elevated   . Skin lesion     of right templs- Dr. Baltazar Najjar    Current Outpatient Prescriptions on File Prior to Visit  Medication Sig Dispense Refill  . lisinopril (PRINIVIL,ZESTRIL) 10 MG tablet TAKE ONE TABLET BY MOUTH EVERY DAY  90 tablet  1  . aspirin 81 MG tablet Take 81 mg by mouth daily.       No current facility-administered medications on file prior to visit.    No Known Allergies  Family History  Problem Relation Age of Onset  . Breast cancer    . Coronary artery disease      male 1st degree relative died of MI age 73  . Hypertension    . Suicidality Father     father committed suicide whe pt was 49 y/o  . Alcohol abuse Father   . Angina Mother   . Cancer Maternal Aunt     History   Social History  . Marital Status: Widowed    Spouse Name: N/A    Number of Children: 3  . Years of Education: N/A   Occupational History  .     Social History Main Topics  . Smoking status: Never Smoker   . Smokeless tobacco: Never Used  . Alcohol Use: No  . Drug Use: No  . Sexual Activity: None   Other Topics Concern  . None   Social History Narrative   Retired - worked as Games developer   Widowed since 2005   2 daughters, 1 son  (son lives nearby)   Never Smoked    Alcohol use-no    Review of Systems - See HPI.  All other ROS are negative.  Filed Vitals:   03/26/13 0751  BP: 128/68  Pulse: 64  Temp: 97.4 F (36.3 C)  Resp: 16   Physical Exam   Vitals reviewed. Constitutional: He is oriented to person, place, and time and well-developed, well-nourished, and in no distress.  HENT:  Head: Normocephalic and atraumatic.  Eyes: Conjunctivae are normal. Pupils are equal, round, and reactive to light.  Neck: Neck supple.  Cardiovascular: Normal rate, regular rhythm, normal heart sounds and intact distal pulses.   Pulmonary/Chest: Effort normal and breath sounds normal. No respiratory distress. He has no wheezes. He has no rales. He exhibits no tenderness.  Neurological: He is alert and oriented to person, place, and time.  Skin: Skin is warm and dry. No rash noted.  Psychiatric: Affect normal.   Assessment/Plan: No problem-specific assessment & plan notes found for this encounter.

## 2013-03-26 NOTE — Progress Notes (Signed)
Pre visit review using our clinic review tool, if applicable. No additional management support is needed unless otherwise documented below in the visit note. 

## 2013-03-26 NOTE — Assessment & Plan Note (Signed)
BP good in clinic today.  Asymptomatic.  Will continue current regimen.  Refill of lisinopril given -- 3 month supply with 1 refill.  Follow-up in 6 months for complete physical with fasting labs.

## 2013-03-26 NOTE — Assessment & Plan Note (Signed)
Very mildly elevated.  Start krill oil or fish oil supplement daily.  Walk for 30 minutes daily.  Avoid foods high in cholesterol and/or saturated fats.  Return in 6 months for complete physical.

## 2013-06-05 ENCOUNTER — Encounter (HOSPITAL_BASED_OUTPATIENT_CLINIC_OR_DEPARTMENT_OTHER): Payer: Self-pay | Admitting: Emergency Medicine

## 2013-06-05 ENCOUNTER — Other Ambulatory Visit: Payer: Self-pay

## 2013-06-05 ENCOUNTER — Emergency Department (HOSPITAL_BASED_OUTPATIENT_CLINIC_OR_DEPARTMENT_OTHER)
Admission: EM | Admit: 2013-06-05 | Discharge: 2013-06-05 | Disposition: A | Discharge status: Home / Self Care | Payer: Medicare HMO | Attending: Emergency Medicine | Admitting: Emergency Medicine

## 2013-06-05 ENCOUNTER — Emergency Department (HOSPITAL_BASED_OUTPATIENT_CLINIC_OR_DEPARTMENT_OTHER): Payer: Medicare HMO

## 2013-06-05 DIAGNOSIS — R42 Dizziness and giddiness: Secondary | ICD-10-CM

## 2013-06-05 DIAGNOSIS — Z872 Personal history of diseases of the skin and subcutaneous tissue: Secondary | ICD-10-CM | POA: Insufficient documentation

## 2013-06-05 DIAGNOSIS — Z7982 Long term (current) use of aspirin: Secondary | ICD-10-CM | POA: Insufficient documentation

## 2013-06-05 DIAGNOSIS — Z860100 Personal history of colon polyps, unspecified: Secondary | ICD-10-CM | POA: Insufficient documentation

## 2013-06-05 DIAGNOSIS — I10 Essential (primary) hypertension: Secondary | ICD-10-CM | POA: Insufficient documentation

## 2013-06-05 DIAGNOSIS — Y939 Activity, unspecified: Secondary | ICD-10-CM | POA: Insufficient documentation

## 2013-06-05 DIAGNOSIS — W1809XA Striking against other object with subsequent fall, initial encounter: Secondary | ICD-10-CM | POA: Insufficient documentation

## 2013-06-05 DIAGNOSIS — S199XXA Unspecified injury of neck, initial encounter: Secondary | ICD-10-CM

## 2013-06-05 DIAGNOSIS — Y929 Unspecified place or not applicable: Secondary | ICD-10-CM | POA: Insufficient documentation

## 2013-06-05 DIAGNOSIS — S0993XA Unspecified injury of face, initial encounter: Secondary | ICD-10-CM | POA: Insufficient documentation

## 2013-06-05 DIAGNOSIS — Z87442 Personal history of urinary calculi: Secondary | ICD-10-CM | POA: Insufficient documentation

## 2013-06-05 LAB — CBC WITH DIFFERENTIAL/PLATELET
Basophils Absolute: 0 10*3/uL (ref 0.0–0.1)
Basophils Relative: 0 % (ref 0–1)
Eosinophils Absolute: 0.2 10*3/uL (ref 0.0–0.7)
Eosinophils Relative: 2 % (ref 0–5)
HCT: 48.3 % (ref 39.0–52.0)
HEMOGLOBIN: 16.9 g/dL (ref 13.0–17.0)
LYMPHS ABS: 2.3 10*3/uL (ref 0.7–4.0)
LYMPHS PCT: 30 % (ref 12–46)
MCH: 31.6 pg (ref 26.0–34.0)
MCHC: 35 g/dL (ref 30.0–36.0)
MCV: 90.4 fL (ref 78.0–100.0)
MONOS PCT: 12 % (ref 3–12)
Monocytes Absolute: 0.9 10*3/uL (ref 0.1–1.0)
NEUTROS PCT: 55 % (ref 43–77)
Neutro Abs: 4.3 10*3/uL (ref 1.7–7.7)
PLATELETS: 178 10*3/uL (ref 150–400)
RBC: 5.34 MIL/uL (ref 4.22–5.81)
RDW: 13.4 % (ref 11.5–15.5)
WBC: 7.7 10*3/uL (ref 4.0–10.5)

## 2013-06-05 LAB — URINALYSIS, ROUTINE W REFLEX MICROSCOPIC
Bilirubin Urine: NEGATIVE
GLUCOSE, UA: NEGATIVE mg/dL
HGB URINE DIPSTICK: NEGATIVE
Ketones, ur: NEGATIVE mg/dL
Leukocytes, UA: NEGATIVE
Nitrite: NEGATIVE
PH: 6.5 (ref 5.0–8.0)
Protein, ur: NEGATIVE mg/dL
SPECIFIC GRAVITY, URINE: 1.009 (ref 1.005–1.030)
UROBILINOGEN UA: 0.2 mg/dL (ref 0.0–1.0)

## 2013-06-05 LAB — BASIC METABOLIC PANEL
BUN: 18 mg/dL (ref 6–23)
CHLORIDE: 102 meq/L (ref 96–112)
CO2: 26 meq/L (ref 19–32)
Calcium: 10 mg/dL (ref 8.4–10.5)
Creatinine, Ser: 1.1 mg/dL (ref 0.50–1.35)
GFR calc Af Amer: 71 mL/min — ABNORMAL LOW (ref >90)
GFR calc non Af Amer: 61 mL/min — ABNORMAL LOW (ref >90)
GLUCOSE: 102 mg/dL — AB (ref 70–99)
POTASSIUM: 4.4 meq/L (ref 3.7–5.3)
SODIUM: 141 meq/L (ref 137–147)

## 2013-06-05 MED ORDER — MECLIZINE HCL 25 MG PO TABS: 25.0000 mg | ORAL_TABLET | Freq: Three times a day (TID) | ORAL | Status: DC | PRN

## 2013-06-05 MED ORDER — MECLIZINE HCL 25 MG PO TABS
25.0000 mg | ORAL_TABLET | Freq: Once | ORAL | Status: AC
  Administered 2013-06-05: 25 mg via ORAL
  Filled 2013-06-05: qty 1

## 2013-06-05 NOTE — ED Provider Notes (Addendum)
CSN: [id number]     Arrival date & time 06/05/13  2103 History  This chart was scribed for Derrick Johns, MD by Derrick Schultz, ED Scribe. The patient was seen in room MH08/MH08. Patient's care was started at 9:31 PM.    Chief Complaint  Patient presents with  . Dizziness   The history is provided by the patient. No language interpreter was used.   HPI Comments: Derrick Schultz is a 80 y.o. male who presents to the Emergency Department complaining of intermittent dizziness that started about 3 days ago.  Pt describes the dizziness as the room is spinning.  Pt states the first episode occurred Tuesday at 4:00AM.  He states he became dizzy when he stood up, which caused him to fall and hit his neck on the night stand.  Pt states he was able to crawl to the bathroom and pull himself up.  He states he was able to walk back to the bathroom and fall back asleep.  Pt denies neck pain from the fall, but states his neck is stiff.  He states he didn't do much on Wednesday, but when he would stand up he experienced slight dizzy spells.  Pt states he came to the ED tonight, because he experienced the dizziness again this evening.  He states the same complaint occurred about 4 years ago.  He states he was diagnosed with vertigo and after he took his medication he felt better.  He denies any SOB, rhinorrhea, and congestion.  Pt denies any dysphagia, vision changes, numbness, and tingling in his extremities.   Pt also states he decided to cut his blood pressure medication in half for the past six days.  He states since that decision he feels much better with more energy.  Pt states he decided to cut back on the medication, because he was feeling tired with the entire pill.  He has had those symptoms of fatigue for years. Pt reports he has been on this medication for about 11 years.  Pt is not a smoker.    Past Medical History  Diagnosis Date  . Hypertension   . History of colonic polyps   . History of  nephrolithiasis     lithotripsy  . Elevated PSA     history of elevated   . Skin lesion     of right templs- Dr. Baltazar Najjar   Past Surgical History  Procedure Laterality Date  . Colonoscopy      x 2 intial showed polyps  . Hernia repair      right inguinal   Family History  Problem Relation Age of Onset  . Breast cancer    . Coronary artery disease      male 1st degree relative died of MI age 72  . Hypertension    . Suicidality Father     father committed suicide whe pt was 73 y/o  . Alcohol abuse Father   . Angina Mother   . Cancer Maternal Aunt    History  Substance Use Topics  . Smoking status: Never Smoker   . Smokeless tobacco: Never Used  . Alcohol Use: No    Review of Systems  Constitutional: Negative for fever, chills, diaphoresis and fatigue.  HENT: Negative for congestion, ear pain, rhinorrhea, sneezing, sore throat and trouble swallowing.   Eyes: Negative.  Negative for visual disturbance.  Respiratory: Negative for cough, chest tightness and shortness of breath.   Cardiovascular: Negative for chest pain and leg swelling.  Gastrointestinal: Negative  for nausea, vomiting, abdominal pain, diarrhea and blood in stool.  Genitourinary: Negative for frequency, hematuria, flank pain and difficulty urinating.  Musculoskeletal: Negative for arthralgias and back pain.  Skin: Negative for color change, rash and wound.  Neurological: Positive for dizziness. Negative for syncope, speech difficulty, weakness, numbness and headaches.  Psychiatric/Behavioral: Negative for behavioral problems and confusion.  All other systems reviewed and are negative.    Allergies  Review of patient's allergies indicates no known allergies.  Home Medications   Current Outpatient Rx  Name  Route  Sig  Dispense  Refill  . aspirin 81 MG tablet   Oral   Take 81 mg by mouth daily.         Marland Kitchen lisinopril (PRINIVIL,ZESTRIL) 10 MG tablet      TAKE ONE TABLET BY MOUTH EVERY DAY   90  tablet   1   . meclizine (ANTIVERT) 25 MG tablet   Oral   Take 1 tablet (25 mg total) by mouth 3 (three) times daily as needed for dizziness.   30 tablet   0    Triage Vitals: BP 159/95  Pulse 72  Temp(Src) 98.4 F (36.9 C) (Oral)  Resp 20  Ht 5\' 7"  (1.702 m)  Wt 156 lb (70.761 kg)  BMI 24.43 kg/m2  SpO2 100%  Physical Exam  Nursing note and vitals reviewed. Constitutional: He is oriented to person, place, and time. He appears well-developed and well-nourished. No distress.  HENT:  Head: Normocephalic and atraumatic.  Right Ear: External ear normal.  Left Ear: External ear normal.  Eyes: Conjunctivae, EOM and lids are normal. Pupils are equal, round, and reactive to light. Right eye exhibits no nystagmus. Left eye exhibits no nystagmus.  Neck: Normal range of motion. Neck supple. No tracheal deviation present.  Cardiovascular: Normal rate, regular rhythm and normal heart sounds.   Pulmonary/Chest: Effort normal and breath sounds normal. No respiratory distress. He has no wheezes. He has no rales. He exhibits no tenderness.  Abdominal: Soft. Bowel sounds are normal. He exhibits no distension and no mass. There is no tenderness. There is no rebound and no guarding.  Musculoskeletal: Normal range of motion. He exhibits no edema.  Lymphadenopathy:    He has no cervical adenopathy.  Neurological: He is alert and oriented to person, place, and time. No cranial nerve deficit. Coordination normal.  Motor intact.  No pronator drift.  Finger to nose intact.    Skin: Skin is warm and dry. No rash noted.  Psychiatric: He has a normal mood and affect. His behavior is normal. Judgment and thought content normal.    ED Course  Procedures (including critical care time) DIAGNOSTIC STUDIES: Oxygen Saturation is 100% on RA, normal by my interpretation.    COORDINATION OF CARE: 9:48 PM-Will order Ct of head and cervical spine. Will order CBC with diff, Basic metabolic panel, and UA,  Pt  informed of current plan of treatment and evaluation and agrees with plan.    Results for orders placed during the hospital encounter of 06/05/13  CBC WITH DIFFERENTIAL      Result Value Ref Range   WBC 7.7  4.0 - 10.5 K/uL   RBC 5.34  4.22 - 5.81 MIL/uL   Hemoglobin 16.9  13.0 - 17.0 g/dL   HCT 48.3  39.0 - 52.0 %   MCV 90.4  78.0 - 100.0 fL   MCH 31.6  26.0 - 34.0 pg   MCHC 35.0  30.0 - 36.0 g/dL  RDW 13.4  11.5 - 15.5 %   Platelets 178  150 - 400 K/uL   Neutrophils Relative % 55  43 - 77 %   Neutro Abs 4.3  1.7 - 7.7 K/uL   Lymphocytes Relative 30  12 - 46 %   Lymphs Abs 2.3  0.7 - 4.0 K/uL   Monocytes Relative 12  3 - 12 %   Monocytes Absolute 0.9  0.1 - 1.0 K/uL   Eosinophils Relative 2  0 - 5 %   Eosinophils Absolute 0.2  0.0 - 0.7 K/uL   Basophils Relative 0  0 - 1 %   Basophils Absolute 0.0  0.0 - 0.1 K/uL  BASIC METABOLIC PANEL      Result Value Ref Range   Sodium 141  137 - 147 mEq/L   Potassium 4.4  3.7 - 5.3 mEq/L   Chloride 102  96 - 112 mEq/L   CO2 26  19 - 32 mEq/L   Glucose, Bld 102 (*) 70 - 99 mg/dL   BUN 18  6 - 23 mg/dL   Creatinine, Ser 1.10  0.50 - 1.35 mg/dL   Calcium 10.0  8.4 - 10.5 mg/dL   GFR calc non Af Amer 61 (*) >90 mL/min   GFR calc Af Amer 71 (*) >90 mL/min  URINALYSIS, ROUTINE W REFLEX MICROSCOPIC      Result Value Ref Range   Color, Urine YELLOW  YELLOW   APPearance CLEAR  CLEAR   Specific Gravity, Urine 1.009  1.005 - 1.030   pH 6.5  5.0 - 8.0   Glucose, UA NEGATIVE  NEGATIVE mg/dL   Hgb urine dipstick NEGATIVE  NEGATIVE   Bilirubin Urine NEGATIVE  NEGATIVE   Ketones, ur NEGATIVE  NEGATIVE mg/dL   Protein, ur NEGATIVE  NEGATIVE mg/dL   Urobilinogen, UA 0.2  0.0 - 1.0 mg/dL   Nitrite NEGATIVE  NEGATIVE   Leukocytes, UA NEGATIVE  NEGATIVE    Imaging Review Ct Head Wo Contrast  06/05/2013   CLINICAL DATA:  [place] 2 days ago, dizziness and posterior neck pain  EXAM: CT HEAD WITHOUT CONTRAST  CT CERVICAL SPINE WITHOUT CONTRAST   TECHNIQUE: Multidetector CT imaging of the head and cervical spine was performed following the standard protocol without intravenous contrast. Multiplanar CT image reconstructions of the cervical spine were also generated.  COMPARISON:  None.  FINDINGS: CT HEAD FINDINGS  Negative for acute intracranial hemorrhage, acute infarction, mass, mass effect, hydrocephalus or midline shift. Gray-white differentiation is preserved throughout. Global cerebral and cerebellar volume loss consistent with involutional age related changes. Periventricular, subcortical and deep white matter hypoattenuation is nonspecific but most consistent with the sequela of longstanding microvascular ischemic white matter change. No focal scalp contusion, or hematoma. No acute calvarial abnormality normal aeration of the mastoid air cells and visualized paranasal sinuses. Atherosclerotic calcifications in the bilateral cavernous carotid arteries.  CT CERVICAL SPINE FINDINGS  No acute fracture, malalignment or prevertebral soft tissue swelling. Multilevel cervical spondylosis and left worse than right facet arthropathy. Degenerative disc disease is most severe at C4-C5 and C5-C6. Facet arthropathy is most advanced on the right at C3-C4. There is evidence of central canal stenosis at C4-C5 and C5-C6. Unremarkable CT appearance of the thyroid gland. No acute soft tissue abnormality. The lung apices are unremarkable.  IMPRESSION: CT HEAD  1. No acute intracranial abnormality. 2. Age related atrophy and moderate chronic microvascular ischemic white matter disease. 3. Intracranial atherosclerosis. CT CSPINE  1. No acute fracture or  malalignment. 2. Multilevel degenerative disc disease and facet arthropathy.   Electronically Signed   By: Jacqulynn Cadet M.D.   On: 06/05/2013 23:14   Ct Cervical Spine Wo Contrast  06/05/2013   CLINICAL DATA:  [place] 2 days ago, dizziness and posterior neck pain  EXAM: CT HEAD WITHOUT CONTRAST  CT CERVICAL SPINE WITHOUT  CONTRAST  TECHNIQUE: Multidetector CT imaging of the head and cervical spine was performed following the standard protocol without intravenous contrast. Multiplanar CT image reconstructions of the cervical spine were also generated.  COMPARISON:  None.  FINDINGS: CT HEAD FINDINGS  Negative for acute intracranial hemorrhage, acute infarction, mass, mass effect, hydrocephalus or midline shift. Gray-white differentiation is preserved throughout. Global cerebral and cerebellar volume loss consistent with involutional age related changes. Periventricular, subcortical and deep white matter hypoattenuation is nonspecific but most consistent with the sequela of longstanding microvascular ischemic white matter change. No focal scalp contusion, or hematoma. No acute calvarial abnormality normal aeration of the mastoid air cells and visualized paranasal sinuses. Atherosclerotic calcifications in the bilateral cavernous carotid arteries.  CT CERVICAL SPINE FINDINGS  No acute fracture, malalignment or prevertebral soft tissue swelling. Multilevel cervical spondylosis and left worse than right facet arthropathy. Degenerative disc disease is most severe at C4-C5 and C5-C6. Facet arthropathy is most advanced on the right at C3-C4. There is evidence of central canal stenosis at C4-C5 and C5-C6. Unremarkable CT appearance of the thyroid gland. No acute soft tissue abnormality. The lung apices are unremarkable.  IMPRESSION: CT HEAD  1. No acute intracranial abnormality. 2. Age related atrophy and moderate chronic microvascular ischemic white matter disease. 3. Intracranial atherosclerosis. CT CSPINE  1. No acute fracture or malalignment. 2. Multilevel degenerative disc disease and facet arthropathy.   Electronically Signed   By: Jacqulynn Cadet M.D.   On: 06/05/2013 23:14    Date: 06/05/2013  Rate: 66  Rhythm: normal sinus rhythm  QRS Axis: normal  Intervals: normal  ST/T Wave abnormalities: normal  Conduction  Disutrbances:none  Narrative Interpretation:   Old EKG Reviewed: unchanged   MDM   Final diagnoses:  Vertigo   Patient presents with dizziness which sounds consistent with vertigo. He has no other neurologic deficits. He has no speech deficits. He has no persistent headache. He ambulate around the ED without problem. He has no other symptoms that would be more suggestive of a central cause of the vertigo. He has no palpitations, exertional symptoms or chest pain that would be more consistent with acute coronary syndrome. He was given meclizine.  He was discharged home and I advised him to follow up with his primary care physician within the next week for recheck or return here if his symptoms worsen.  I personally performed the services described in this documentation, which was scribed in my presence.  The recorded information has been reviewed and considered.      Derrick Johns, MD 06/05/13 2352  Derrick Johns, MD 06/05/13 [phone]

## 2013-06-05 NOTE — Discharge Instructions (Signed)
Vertigo Vertigo means you feel like you or your surroundings are moving when they are not. Vertigo can be dangerous if it occurs when you are at work, driving, or performing difficult activities.  CAUSES  Vertigo occurs when there is a conflict of signals sent to your brain from the visual and sensory systems in your body. There are many different causes of vertigo, including:  Infections, especially in the inner ear.  A bad reaction to a drug or misuse of alcohol and medicines.  Withdrawal from drugs or alcohol.  Rapidly changing positions, such as lying down or rolling over in bed.  A migraine headache.  Decreased blood flow to the brain.  Increased pressure in the brain from a head injury, infection, tumor, or bleeding. SYMPTOMS  You may feel as though the world is spinning around or you are falling to the ground. Because your balance is upset, vertigo can cause nausea and vomiting. You may have involuntary eye movements (nystagmus). DIAGNOSIS  Vertigo is usually diagnosed by physical exam. If the cause of your vertigo is unknown, your caregiver may perform imaging tests, such as an MRI scan (magnetic resonance imaging). TREATMENT  Most cases of vertigo resolve on their own, without treatment. Depending on the cause, your caregiver may prescribe certain medicines. If your vertigo is related to body position issues, your caregiver may recommend movements or procedures to correct the problem. In rare cases, if your vertigo is caused by certain inner ear problems, you may need surgery. HOME CARE INSTRUCTIONS   Follow your caregiver's instructions.  Avoid driving.  Avoid operating heavy machinery.  Avoid performing any tasks that would be dangerous to you or others during a vertigo episode.  Tell your caregiver if you notice that certain medicines seem to be causing your vertigo. Some of the medicines used to treat vertigo episodes can actually make them worse in some people. SEEK  IMMEDIATE MEDICAL CARE IF:   Your medicines do not relieve your vertigo or are making it worse.  You develop problems with talking, walking, weakness, or using your arms, hands, or legs.  You develop severe headaches.  Your nausea or vomiting continues or gets worse.  You develop visual changes.  A family member notices behavioral changes.  Your condition gets worse. MAKE SURE YOU:  Understand these instructions.  Will watch your condition.  Will get help right away if you are not doing well or get worse. Document Released: 11/30/2004 Document Revised: 05/15/2011 Document Reviewed: 09/08/2010 [employer] Patient Information 2014 [employer].  

## 2013-06-05 NOTE — ED Notes (Signed)
MD at bedside. 

## 2013-06-05 NOTE — ED Notes (Signed)
C/o intermittent dizziness x 2 days

## 2013-09-24 ENCOUNTER — Telehealth: Payer: Self-pay | Admitting: Physician Assistant

## 2013-09-24 ENCOUNTER — Ambulatory Visit (INDEPENDENT_AMBULATORY_CARE_PROVIDER_SITE_OTHER): Payer: Managed Care, Other (non HMO) | Admitting: Physician Assistant

## 2013-09-24 ENCOUNTER — Encounter: Payer: Self-pay | Admitting: Physician Assistant

## 2013-09-24 VITALS — BP 138/82 | HR 58 | Temp 97.7°F | Resp 16 | Ht 66.0 in | Wt 152.0 lb

## 2013-09-24 DIAGNOSIS — G4700 Insomnia, unspecified: Secondary | ICD-10-CM

## 2013-09-24 DIAGNOSIS — I10 Essential (primary) hypertension: Secondary | ICD-10-CM

## 2013-09-24 MED ORDER — ZOLPIDEM TARTRATE 5 MG PO TABS: 5.0000 mg | ORAL_TABLET | Freq: Every evening | ORAL | Status: DC | PRN

## 2013-09-24 MED ORDER — LISINOPRIL 10 MG PO TABS: ORAL_TABLET | ORAL | Status: DC

## 2013-09-24 NOTE — Patient Instructions (Signed)
Please continue medications as directed.  Take ambien (1/2 tablet) at bedtime, just before getting into bed.  Please follow-up in 1 month and we can do your Complete Physical at that time.  Insomnia Insomnia is frequent trouble falling and/or staying asleep. Insomnia can be a long term problem or a short term problem. Both are common. Insomnia can be a short term problem when the wakefulness is related to a certain stress or worry. Long term insomnia is often related to ongoing stress during waking hours and/or poor sleeping habits. Overtime, sleep deprivation itself can make the problem worse. Every little thing feels more severe because you are overtired and your ability to cope is decreased. CAUSES   Stress, anxiety, and depression.  Poor sleeping habits.  Distractions such as TV in the bedroom.  Naps close to bedtime.  Engaging in emotionally charged conversations before bed.  Technical reading before sleep.  Alcohol and other sedatives. They may make the problem worse. They can hurt normal sleep patterns and normal dream activity.  Stimulants such as caffeine for several hours prior to bedtime.  Pain syndromes and shortness of breath can cause insomnia.  Exercise late at night.  Changing time zones may cause sleeping problems (jet lag). It is sometimes helpful to have someone observe your sleeping patterns. They should look for periods of not breathing during the night (sleep apnea). They should also look to see how long those periods last. If you live alone or observers are uncertain, you can also be observed at a sleep clinic where your sleep patterns will be professionally monitored. Sleep apnea requires a checkup and treatment. Give your caregivers your medical history. Give your caregivers observations your family has made about your sleep.  SYMPTOMS   Not feeling rested in the morning.  Anxiety and restlessness at bedtime.  Difficulty falling and staying  asleep. TREATMENT   Your caregiver may prescribe treatment for an underlying medical disorders. Your caregiver can give advice or help if you are using alcohol or other drugs for self-medication. Treatment of underlying problems will usually eliminate insomnia problems.  Medications can be prescribed for short time use. They are generally not recommended for lengthy use.  Over-the-counter sleep medicines are not recommended for lengthy use. They can be habit forming.  You can promote easier sleeping by making lifestyle changes such as:  Using relaxation techniques that help with breathing and reduce muscle tension.  Exercising earlier in the day.  Changing your diet and the time of your last meal. No night time snacks.  Establish a regular time to go to bed.  Counseling can help with stressful problems and worry.  Soothing music and white noise may be helpful if there are background noises you cannot remove.  Stop tedious detailed work at least one hour before bedtime. HOME CARE INSTRUCTIONS   Keep a diary. Inform your caregiver about your progress. This includes any medication side effects. See your caregiver regularly. Take note of:  Times when you are asleep.  Times when you are awake during the night.  The quality of your sleep.  How you feel the next day. This information will help your caregiver care for you.  Get out of bed if you are still awake after 15 minutes. Read or do some quiet activity. Keep the lights down. Wait until you feel sleepy and go back to bed.  Keep regular sleeping and waking hours. Avoid naps.  Exercise regularly.  Avoid distractions at bedtime. Distractions include watching television or  engaging in any intense or detailed activity like attempting to balance the household checkbook.  Develop a bedtime ritual. Keep a familiar routine of bathing, brushing your teeth, climbing into bed at the same time each night, listening to soothing music.  Routines increase the success of falling to sleep faster.  Use relaxation techniques. This can be using breathing and muscle tension release routines. It can also include visualizing peaceful scenes. You can also help control troubling or intruding thoughts by keeping your mind occupied with boring or repetitive thoughts like the old concept of counting sheep. You can make it more creative like imagining planting one beautiful flower after another in your backyard garden.  During your day, work to eliminate stress. When this is not possible use some of the previous suggestions to help reduce the anxiety that accompanies stressful situations. MAKE SURE YOU:   Understand these instructions.  Will watch your condition.  Will get help right away if you are not doing well or get worse. Document Released: 02/18/2000 Document Revised: 05/15/2011 Document Reviewed: 03/20/2007 [facility] Patient Information 2015 [place], [place]. This information is not intended to replace advice given to you by your health care provider. Make sure you discuss any questions you have with your health care provider.

## 2013-09-24 NOTE — Progress Notes (Signed)
Patient presents to clinic today for follow-up of hypertension.  Endorses taking medication as directed. Denies chest pain, palpitations, vision changes, lightheadedness or dizziness.  BP normotensive in clinic.  Patient c/o insomnia.  Difficulty staying asleep.  Endorses 3-6 hours of sleep.  Has tried melatonin in the past without success.  Has never been on prescription medication before.  Past Medical History  Diagnosis Date  . Hypertension   . History of colonic polyps   . History of nephrolithiasis     lithotripsy  . Elevated PSA     history of elevated   . Skin lesion     of right templs- Dr. Baltazar Najjar    No current outpatient prescriptions on file prior to visit.   No current facility-administered medications on file prior to visit.    No Known Allergies  Family History  Problem Relation Age of Onset  . Breast cancer    . Coronary artery disease      male 1st degree relative died of MI age 93  . Hypertension    . Suicidality Father     father committed suicide whe pt was 24 y/o  . Alcohol abuse Father   . Angina Mother   . Cancer Maternal Aunt     History   Social History  . Marital Status: Widowed    Spouse Name: N/A    Number of Children: 3  . Years of Education: N/A   Occupational History  .     Social History Main Topics  . Smoking status: Never Smoker   . Smokeless tobacco: Never Used  . Alcohol Use: No  . Drug Use: No  . Sexual Activity: None   Other Topics Concern  . None   Social History Narrative   Retired - worked as Games developer   Widowed since 2005   2 daughters, 1 son  (son lives nearby)   Never Smoked    Alcohol use-no    Review of Systems - See HPI.  All other ROS are negative.  BP 138/82  Pulse 58  Temp(Src) 97.7 F (36.5 C) (Oral)  Resp 16  Ht 5\' 6"  (1.676 m)  Wt 152 lb (68.947 kg)  BMI 24.55 kg/m2  SpO2 98%  Physical Exam  Vitals reviewed. Constitutional: He is oriented to person, place, and time and well-developed,  well-nourished, and in no distress.  HENT:  Head: Normocephalic and atraumatic.  Eyes: Conjunctivae are normal.  Cardiovascular: Normal rate, regular rhythm, normal heart sounds and intact distal pulses.   Pulmonary/Chest: Effort normal and breath sounds normal. No respiratory distress. He has no wheezes. He has no rales. He exhibits no tenderness.  Neurological: He is alert and oriented to person, place, and time.  Skin: Skin is warm and dry. No rash noted.  Psychiatric: Affect normal.   Assessment/Plan: HYPERTENSION Well-controlled.  Continue current regimen. Follow-up in 1 month for CPE with fasting labs.  Insomnia Will attempt trial of Ambien 5 mg tablet.  Patient is to take 1/2 tablet at bedtime.  Follow-up in 1 month.

## 2013-09-24 NOTE — Telephone Encounter (Signed)
Relevant patient education assigned to patient using Emmi. ° °

## 2013-09-24 NOTE — Assessment & Plan Note (Signed)
Well-controlled.  Continue current regimen. Follow-up in 1 month for CPE with fasting labs.

## 2013-09-24 NOTE — Telephone Encounter (Signed)
Received form for PA on Zolpidem Tartrate, forward to nurse

## 2013-09-24 NOTE — Assessment & Plan Note (Signed)
Will attempt trial of Ambien 5 mg tablet.  Patient is to take 1/2 tablet at bedtime.  Follow-up in 1 month.

## 2013-09-24 NOTE — Progress Notes (Signed)
Pre visit review using our clinic review tool, if applicable. No additional management support is needed unless otherwise documented below in the visit note/SLS  

## 2013-09-25 NOTE — Telephone Encounter (Signed)
Prior Authorization request filed electronically via Cover My Meds/SLS

## 2013-09-29 NOTE — Telephone Encounter (Signed)
Received Approval valid through 12.31.15; approval faxed to phamracy & pt informed/SLS

## 2013-11-25 ENCOUNTER — Other Ambulatory Visit: Payer: Self-pay | Admitting: Physician Assistant

## 2013-11-25 NOTE — Telephone Encounter (Signed)
eScribe request from [facility] for refill on Zolpidem Last filled - 07.22.15, #[id number] Last AEX - 07.22.15 Next AEX - 1-Mth. [Annual Exam not scheduled] Please Advise on refills/SLS

## 2013-11-26 NOTE — Telephone Encounter (Signed)
Rx request to pharmacy/SLS  

## 2013-11-27 ENCOUNTER — Other Ambulatory Visit: Payer: Self-pay | Admitting: Physician Assistant

## 2013-11-27 DIAGNOSIS — G4700 Insomnia, unspecified: Secondary | ICD-10-CM

## 2013-11-27 NOTE — Telephone Encounter (Signed)
Medication refilled

## 2014-03-26 ENCOUNTER — Other Ambulatory Visit: Payer: Self-pay | Admitting: Physician Assistant

## 2014-03-26 NOTE — Telephone Encounter (Signed)
Informed patient of medication refill and he scheduled medicare wellness

## 2014-03-26 NOTE — Telephone Encounter (Signed)
90 day supply Lisinopril sent to pharmacy. Pt is past due for CPE.  Please call pt to arrange fasting medicare wellness exam with Elyn Aquas, PA.

## 2014-04-08 ENCOUNTER — Encounter: Payer: Self-pay | Admitting: Physician Assistant

## 2014-04-08 ENCOUNTER — Ambulatory Visit (INDEPENDENT_AMBULATORY_CARE_PROVIDER_SITE_OTHER): Payer: Medicare HMO | Admitting: Physician Assistant

## 2014-04-08 VITALS — BP 143/81 | HR 56 | Temp 97.6°F | Resp 16 | Ht 66.0 in | Wt 152.5 lb

## 2014-04-08 DIAGNOSIS — Z136 Encounter for screening for cardiovascular disorders: Secondary | ICD-10-CM | POA: Insufficient documentation

## 2014-04-08 DIAGNOSIS — Z0000 Encounter for general adult medical examination without abnormal findings: Secondary | ICD-10-CM

## 2014-04-08 DIAGNOSIS — I10 Essential (primary) hypertension: Secondary | ICD-10-CM

## 2014-04-08 LAB — URINALYSIS, ROUTINE W REFLEX MICROSCOPIC
BILIRUBIN URINE: NEGATIVE
HGB URINE DIPSTICK: NEGATIVE
KETONES UR: NEGATIVE
LEUKOCYTES UA: NEGATIVE
Nitrite: NEGATIVE
RBC / HPF: NONE SEEN (ref 0–?)
TOTAL PROTEIN, URINE-UPE24: NEGATIVE
URINE GLUCOSE: NEGATIVE
Urobilinogen, UA: 0.2 (ref 0.0–1.0)
WBC, UA: NONE SEEN (ref 0–?)
pH: 6 (ref 5.0–8.0)

## 2014-04-08 LAB — BASIC METABOLIC PANEL
BUN: 16 mg/dL (ref 6–23)
CALCIUM: 9.8 mg/dL (ref 8.4–10.5)
CO2: 31 meq/L (ref 19–32)
Chloride: 104 mEq/L (ref 96–112)
Creatinine, Ser: 1.06 mg/dL (ref 0.40–1.50)
GFR: 71.22 mL/min (ref >60.00)
GLUCOSE: 90 mg/dL (ref 70–99)
Potassium: 4.9 mEq/L (ref 3.5–5.1)
SODIUM: 138 meq/L (ref 135–145)

## 2014-04-08 LAB — CBC
HEMATOCRIT: 49.9 % (ref 39.0–52.0)
HEMOGLOBIN: 17.1 g/dL — AB (ref 13.0–17.0)
MCHC: 34.2 g/dL (ref 30.0–36.0)
MCV: 90.6 fl (ref 78.0–100.0)
PLATELETS: 190 10*3/uL (ref 150.0–400.0)
RBC: 5.51 Mil/uL (ref 4.22–5.81)
RDW: 13.5 % (ref 11.5–15.5)
WBC: 6.3 10*3/uL (ref 4.0–10.5)

## 2014-04-08 LAB — HEPATIC FUNCTION PANEL
ALBUMIN: 4.2 g/dL (ref 3.5–5.2)
ALT: 16 U/L (ref 0–53)
AST: 17 U/L (ref 0–37)
Alkaline Phosphatase: 53 U/L (ref 39–117)
BILIRUBIN TOTAL: 0.7 mg/dL (ref 0.2–1.2)
Bilirubin, Direct: 0.1 mg/dL (ref 0.0–0.3)
TOTAL PROTEIN: 7 g/dL (ref 6.0–8.3)

## 2014-04-08 LAB — LIPID PANEL
CHOL/HDL RATIO: 5
CHOLESTEROL: 159 mg/dL (ref 0–200)
HDL: 34.5 mg/dL — AB (ref >39.00)
LDL Cholesterol: 94 mg/dL (ref 0–99)
NONHDL: 124.5
Triglycerides: 153 mg/dL — ABNORMAL HIGH (ref 0.0–149.0)
VLDL: 30.6 mg/dL (ref 0.0–40.0)

## 2014-04-08 LAB — HEMOGLOBIN A1C: Hgb A1c MFr Bld: 5.6 % (ref 4.6–6.5)

## 2014-04-08 LAB — TSH: TSH: 4.37 u[IU]/mL (ref 0.35–4.50)

## 2014-04-08 NOTE — Progress Notes (Signed)
Pre visit review using our clinic review tool, if applicable. No additional management support is needed unless otherwise documented below in the visit note/SLS  

## 2014-04-08 NOTE — Progress Notes (Signed)
. Subjective:    Derrick Schultz is a 81 y.o. male who presents for Medicare Annual/Subsequent preventive examination.  Is also requesting CPE today.  Is fasting for labs.   Preventive Screening-Counseling & Management  Tobacco History  Smoking status  . Never Smoker   Smokeless tobacco  . Never Used   No acute concerns today.  Current Problems (verified) Patient Active Problem List   Diagnosis Date Noted  . Insomnia 09/24/2013  . Hyperlipidemia 03/26/2013  . Low back pain radiating to left leg 08/02/2012  . Non-healing skin lesion 12/05/2010  . Annual physical exam 09/10/2010  . INTERMITTENT VERTIGO 03/09/2010  . ACTINIC KERATOSIS 10/25/2009  . DEGENERATIVE JOINT DISEASE, HANDS 10/25/2009  . PROSTATE SPECIFIC ANTIGEN, ELEVATED 04/30/2009  . HYPERTENSION 04/28/2009  . BENIGN PROSTATIC HYPERTROPHY, WITH OBSTRUCTION 04/28/2009  . COLONIC POLYPS, HX OF 04/28/2009  . NEPHROLITHIASIS, HX OF 04/28/2009    Medications Prior to Visit Current Outpatient Prescriptions on File Prior to Visit  Medication Sig Dispense Refill  . diphenhydrAMINE (BENADRYL) 25 MG tablet Take 25 mg by mouth at bedtime as needed.    Marland Kitchen lisinopril (PRINIVIL,ZESTRIL) 10 MG tablet TAKE ONE TABLET BY MOUTH ONCE DAILY 90 tablet 0  . naproxen sodium (ANAPROX) 220 MG tablet Take 220 mg by mouth as needed.    . zolpidem (AMBIEN) 5 MG tablet TAKE ONE TABLET BY MOUTH AT BEDTIME AS NEEDED FOR SLEEP 30 tablet 0   No current facility-administered medications on file prior to visit.    Current Medications (verified) Current Outpatient Prescriptions  Medication Sig Dispense Refill  . diphenhydrAMINE (BENADRYL) 25 MG tablet Take 25 mg by mouth at bedtime as needed.    Marland Kitchen lisinopril (PRINIVIL,ZESTRIL) 10 MG tablet TAKE ONE TABLET BY MOUTH ONCE DAILY 90 tablet 0  . Multiple Vitamin (MULTIVITAMIN) tablet Take 1 tablet by mouth daily.    . naproxen sodium (ANAPROX) 220 MG tablet Take 220 mg by mouth as needed.    .  zolpidem (AMBIEN) 5 MG tablet TAKE ONE TABLET BY MOUTH AT BEDTIME AS NEEDED FOR SLEEP 30 tablet 0   No current facility-administered medications for this visit.     Allergies (verified) Review of patient's allergies indicates no known allergies.   PAST HISTORY  Family History Family History  Problem Relation Age of Onset  . Breast cancer    . Coronary artery disease      male 1st degree relative died of MI age 14  . Hypertension    . Suicidality Father     father committed suicide whe pt was 89 y/o  . Alcohol abuse Father   . Angina Mother   . Cancer Maternal Aunt     Social History History  Substance Use Topics  . Smoking status: Never Smoker   . Smokeless tobacco: Never Used  . Alcohol Use: No   Are there smokers in your home (other than you)?  No  Risk Factors Current exercise habits: Home exercise routine includes walking 1 hrs per day.  Dietary issues discussed: Body mass index is 24.63 kg/(m^2).  Patient endorses a well-balanced diet with great intake of vegetables.  Drinks mostly water and some juice.   Cardiac risk factors: advanced age (older than 30 for men, 29 for women), dyslipidemia, family history of premature cardiovascular disease, hypertension and male gender.  Depression Screen (Note: if answer to either of the following is "Yes", a more complete depression screening is indicated)   Q1: Over the past two weeks, have you  felt down, depressed or hopeless? No  Q2: Over the past two weeks, have you felt little interest or pleasure in doing things? No  Have you lost interest or pleasure in daily life? No  Do you often feel hopeless? No  Do you cry easily over simple problems? No  Activities of Daily Living In your present state of health, do you have any difficulty performing the following activities?:  Driving? No Managing money?  No Feeding yourself? No Getting from bed to chair? No Climbing a flight of stairs? No Preparing food and eating?:  No Bathing or showering? No Getting dressed: No Getting to the toilet? No Using the toilet:No Moving around from place to place: No In the past year have you fallen or had a near fall?:No   Are you sexually active?  Yes  Do you have more than one partner?  No  Hearing Difficulties: No Do you often ask people to speak up or repeat themselves? No Do you experience ringing or noises in your ears? Yes, rare Do you have difficulty understanding soft or whispered voices? Yes   Do you feel that you have a problem with memory? Sometimes hard to remember new things but only rarely.  Do you often misplace items? No  Do you feel safe at home?  Yes  Cognitive Testing  Alert? Yes  Normal Appearance?Yes  Oriented to person? Yes  Place? Yes   Time? Yes  Recall of three objects?  Yes  Can perform simple calculations? Yes  Displays appropriate judgment?Yes  Can read the correct time from a watch face?Yes   Advanced Directives have been discussed with the patient? Yes   List the Names of Other Physician/Practitioners you currently use: 2.  Urology -- Dr. Thomasene Mohair Patient Care Team: Brunetta Jeans, PA-C as PCP - General (Physician Assistant) Arlis Porta, MD as Consulting Physician (Dermatology)   Indicate any recent Medical Services you may have received from other than [facility] providers in the past year (date may be approximate).  Immunization History  Administered Date(s) Administered  . Influenza Split 12/05/2010  . Influenza Whole 11/30/2008, 12/20/2009  . Influenza, High Dose Seasonal PF 01/10/2013  . Influenza-Unspecified 01/04/2014  . Zoster 04/30/2009    Screening Tests Health Maintenance  Topic Date Due  . TETANUS/TDAP  01/09/1950  . PNEUMOCOCCAL POLYSACCHARIDE VACCINE AGE 81 AND OVER  01/08/1998  . PNA vac Low Risk Adult (1 of 2 - PCV13) 01/08/1998  . INFLUENZA VACCINE  10/05/2014  . COLONOSCOPY  04/20/2015  . ZOSTAVAX  Completed    All answers were reviewed with  the patient and necessary referrals were made:  Leeanne Rio, PA-C   04/08/2014   History reviewed: allergies, current medications, past family history, past medical history, past social history, past surgical history and problem list  Review of Systems A comprehensive review of systems was negative.    Objective:     Vision by Snellen chart: right eye:20/25, left eye:20/30 Blood pressure 143/81, pulse 56, temperature 97.6 F (36.4 C), temperature source Oral, resp. rate 16, height 5\' 6"  (1.676 m), weight 152 lb 8 oz (69.174 kg), SpO2 99 %. Body mass index is 24.63 kg/(m^2).  BP 143/81 mmHg  Pulse 56  Temp(Src) 97.6 F (36.4 C) (Oral)  Resp 16  Ht 5\' 6"  (1.676 m)  Wt 152 lb 8 oz (69.174 kg)  BMI 24.63 kg/m2  SpO2 99% General appearance: alert, cooperative, appears stated age and no distress Head: Normocephalic, without obvious abnormality, atraumatic  Eyes: conjunctivae/corneas clear. PERRL, EOM's intact. Fundi benign. Ears: normal TM's and external ear canals both ears Nose: Nares normal. Septum midline. Mucosa normal. No drainage or sinus tenderness. Throat: lips, mucosa, and tongue normal; teeth and gums normal Lungs: clear to auscultation bilaterally Heart: regular rate and rhythm, S1, S2 normal, no murmur, click, rub or gallop Abdomen: soft, non-tender; bowel sounds normal; no masses,  no organomegaly Skin: Skin color, texture, turgor normal. No rashes or lesions Lymph nodes: Cervical, supraclavicular, and axillary nodes normal. Neurologic: Alert and oriented X 3, normal strength and tone. Normal symmetric reflexes. Normal coordination and gait     Assessment:     (1) Medicare Wellness, Subsequent  (2) Annual Physical Exam (3) Hypertension  (4) Insomnia      Plan:     (1) During the course of the visit the patient was educated and counseled about appropriate screening and preventive services including:    Pneumococcal vaccine   Td  vaccine  Screening electrocardiogram  Diabetes screening  Nutrition counseling   Advanced directives: has an advanced directive - a copy HAS NOT been provided.  Diet review for nutrition referral? Yes ____  Not Indicated _X___  Patient Instructions (the written plan) was given to the patient.  Medicare Attestation I have personally reviewed: The patient's medical and social history Their use of alcohol, tobacco or illicit drugs Their current medications and supplements The patient's functional ability including ADLs,fall risks, home safety risks, cognitive, and hearing and visual impairment Diet and physical activities Evidence for depression or mood disorders  The patient's weight, height, BMI, and visual acuity have been recorded in the chart.  I have made referrals, counseling, and provided education to the patient based on review of the above and I have provided the patient with a written personalized care plan for preventive services.    (2) I have reviewed the patient's medical history in detail and updated the computerized patient record.  Health Maintenance topics discussed as part of Annual Exam and Medicare Wellness.  Patient up-to-date on Flu shot. Declines Tetanus today.  Declines Prevnar today.  (3) Well-controlled at present.  Medications refilled.  Will check BMP today. (4) Doing well. No changes to regimen at present.   Raiford Noble [place], [place]   04/08/2014

## 2014-04-08 NOTE — Patient Instructions (Signed)
Please stop by the lab for blood work.  I will call you with your results.  We will treat any abnormalities if they are present.  Please continue medications as directed.  Stay active!  Follow-up with me in 6 months.  Preventive Care for Adults A healthy lifestyle and preventive care can promote health and wellness. Preventive health guidelines for men include the following key practices:  A routine yearly physical is a good way to check with your health care provider about your health and preventative screening. It is a chance to share any concerns and updates on your health and to receive a thorough exam.  Visit your dentist for a routine exam and preventative care every 6 months. Brush your teeth twice a day and floss once a day. Good oral hygiene prevents tooth decay and gum disease.  The frequency of eye exams is based on your age, health, family medical history, use of contact lenses, and other factors. Follow your health care provider's recommendations for frequency of eye exams.  Eat a healthy diet. Foods such as vegetables, fruits, whole grains, low-fat dairy products, and lean protein foods contain the nutrients you need without too many calories. Decrease your intake of foods high in solid fats, added sugars, and salt. Eat the right amount of calories for you.Get information about a proper diet from your health care provider, if necessary.  Regular physical exercise is one of the most important things you can do for your health. Most adults should get at least 150 minutes of moderate-intensity exercise (any activity that increases your heart rate and causes you to sweat) each week. In addition, most adults need muscle-strengthening exercises on 2 or more days a week.  Maintain a healthy weight. The body mass index (BMI) is a screening tool to identify possible weight problems. It provides an estimate of body fat based on height and weight. Your health care provider can find your BMI  and can help you achieve or maintain a healthy weight.For adults 20 years and older:  A BMI below 18.5 is considered underweight.  A BMI of 18.5 to 24.9 is normal.  A BMI of 25 to 29.9 is considered overweight.  A BMI of 30 and above is considered obese.  Maintain normal blood lipids and cholesterol levels by exercising and minimizing your intake of saturated fat. Eat a balanced diet with plenty of fruit and vegetables. Blood tests for lipids and cholesterol should begin at age 47 and be repeated every 5 years. If your lipid or cholesterol levels are high, you are over 50, or you are at high risk for heart disease, you may need your cholesterol levels checked more frequently.Ongoing high lipid and cholesterol levels should be treated with medicines if diet and exercise are not working.  If you smoke, find out from your health care provider how to quit. If you do not use tobacco, do not start.  Lung cancer screening is recommended for adults aged 43-80 years who are at high risk for developing lung cancer because of a history of smoking. A yearly low-dose CT scan of the lungs is recommended for people who have at least a 30-pack-year history of smoking and are a current smoker or have quit within the past 15 years. A pack year of smoking is smoking an average of 1 pack of cigarettes a day for 1 year (for example: 1 pack a day for 30 years or 2 packs a day for 15 years). Yearly screening should continue until  the smoker has stopped smoking for at least 15 years. Yearly screening should be stopped for people who develop a health problem that would prevent them from having lung cancer treatment.  If you choose to drink alcohol, do not have more than 2 drinks per day. One drink is considered to be 12 ounces (355 mL) of beer, 5 ounces (148 mL) of wine, or 1.5 ounces (44 mL) of liquor.  Avoid use of street drugs. Do not share needles with anyone. Ask for help if you need support or instructions about  stopping the use of drugs.  High blood pressure causes heart disease and increases the risk of stroke. Your blood pressure should be checked at least every 1-2 years. Ongoing high blood pressure should be treated with medicines, if weight loss and exercise are not effective.  If you are 52-78 years old, ask your health care provider if you should take aspirin to prevent heart disease.  Diabetes screening involves taking a blood sample to check your fasting blood sugar level. This should be done once every 3 years, after age 18, if you are within normal weight and without risk factors for diabetes. Testing should be considered at a younger age or be carried out more frequently if you are overweight and have at least 1 risk factor for diabetes.  Colorectal cancer can be detected and often prevented. Most routine colorectal cancer screening begins at the age of 72 and continues through age 89. However, your health care provider may recommend screening at an earlier age if you have risk factors for colon cancer. On a yearly basis, your health care provider may provide home test kits to check for hidden blood in the stool. Use of a small camera at the end of a tube to directly examine the colon (sigmoidoscopy or colonoscopy) can detect the earliest forms of colorectal cancer. Talk to your health care provider about this at age 36, when routine screening begins. Direct exam of the colon should be repeated every 5-10 years through age 86, unless early forms of precancerous polyps or small growths are found.  People who are at an increased risk for hepatitis B should be screened for this virus. You are considered at high risk for hepatitis B if:  You were born in a country where hepatitis B occurs often. Talk with your health care provider about which countries are considered high risk.  Your parents were born in a high-risk country and you have not received a shot to protect against hepatitis B (hepatitis B  vaccine).  You have HIV or AIDS.  You use needles to inject street drugs.  You live with, or have sex with, someone who has hepatitis B.  You are a man who has sex with other men (MSM).  You get hemodialysis treatment.  You take certain medicines for conditions such as cancer, organ transplantation, and autoimmune conditions.  Hepatitis C blood testing is recommended for all people born from [birth year] through 1965 and any individual with known risks for hepatitis C.  Practice safe sex. Use condoms and avoid high-risk sexual practices to reduce the spread of sexually transmitted infections (STIs). STIs include gonorrhea, chlamydia, syphilis, trichomonas, herpes, HPV, and human immunodeficiency virus (HIV). Herpes, HIV, and HPV are viral illnesses that have no cure. They can result in disability, cancer, and death.  If you are at risk of being infected with HIV, it is recommended that you take a prescription medicine daily to prevent HIV infection. This is called  preexposure prophylaxis (PrEP). You are considered at risk if:  You are a man who has sex with other men (MSM) and have other risk factors.  You are a heterosexual man, are sexually active, and are at increased risk for HIV infection.  You take drugs by injection.  You are sexually active with a partner who has HIV.  Talk with your health care provider about whether you are at high risk of being infected with HIV. If you choose to begin PrEP, you should first be tested for HIV. You should then be tested every 3 months for as long as you are taking PrEP.  A one-time screening for abdominal aortic aneurysm (AAA) and surgical repair of large AAAs by ultrasound are recommended for men ages 31 to 47 years who are current or former smokers.  Healthy men should no longer receive prostate-specific antigen (PSA) blood tests as part of routine cancer screening. Talk with your health care provider about prostate cancer screening.  Testicular  cancer screening is not recommended for adult males who have no symptoms. Screening includes self-exam, a health care provider exam, and other screening tests. Consult with your health care provider about any symptoms you have or any concerns you have about testicular cancer.  Use sunscreen. Apply sunscreen liberally and repeatedly throughout the day. You should seek shade when your shadow is shorter than you. Protect yourself by wearing long sleeves, pants, a wide-brimmed hat, and sunglasses year round, whenever you are outdoors.  Once a month, do a whole-body skin exam, using a mirror to look at the skin on your back. Tell your health care provider about new moles, moles that have irregular borders, moles that are larger than a pencil eraser, or moles that have changed in shape or color.  Stay current with required vaccines (immunizations).  Influenza vaccine. All adults should be immunized every year.  Tetanus, diphtheria, and acellular pertussis (Td, Tdap) vaccine. An adult who has not previously received Tdap or who does not know his vaccine status should receive 1 dose of Tdap. This initial dose should be followed by tetanus and diphtheria toxoids (Td) booster doses every 10 years. Adults with an unknown or incomplete history of completing a 3-dose immunization series with Td-containing vaccines should begin or complete a primary immunization series including a Tdap dose. Adults should receive a Td booster every 10 years.  Varicella vaccine. An adult without evidence of immunity to varicella should receive 2 doses or a second dose if he has previously received 1 dose.  Human papillomavirus (HPV) vaccine. Males aged 58-21 years who have not received the vaccine previously should receive the 3-dose series. Males aged 22-26 years may be immunized. Immunization is recommended through the age of 47 years for any male who has sex with males and did not get any or all doses earlier. Immunization is  recommended for any person with an immunocompromised condition through the age of 33 years if he did not get any or all doses earlier. During the 3-dose series, the second dose should be obtained 4-8 weeks after the first dose. The third dose should be obtained 24 weeks after the first dose and 16 weeks after the second dose.  Zoster vaccine. One dose is recommended for adults aged 51 years or older unless certain conditions are present.  Measles, mumps, and rubella (MMR) vaccine. Adults born before [birth year] generally are considered immune to measles and mumps. Adults born in [birth year] or later should have 1 or more doses of MMR  vaccine unless there is a contraindication to the vaccine or there is laboratory evidence of immunity to each of the three diseases. A routine second dose of MMR vaccine should be obtained at least 28 days after the first dose for students attending postsecondary schools, health care workers, or international travelers. People who received inactivated measles vaccine or an unknown type of measles vaccine during 1963-1967 should receive 2 doses of MMR vaccine. People who received inactivated mumps vaccine or an unknown type of mumps vaccine before 1979 and are at high risk for mumps infection should consider immunization with 2 doses of MMR vaccine. Unvaccinated health care workers born before 65 who lack laboratory evidence of measles, mumps, or rubella immunity or laboratory confirmation of disease should consider measles and mumps immunization with 2 doses of MMR vaccine or rubella immunization with 1 dose of MMR vaccine.  Pneumococcal 13-valent conjugate (PCV13) vaccine. When indicated, a person who is uncertain of his immunization history and has no record of immunization should receive the PCV13 vaccine. An adult aged 54 years or older who has certain medical conditions and has not been previously immunized should receive 1 dose of PCV13 vaccine. This PCV13 should be followed with a dose  of pneumococcal polysaccharide (PPSV23) vaccine. The PPSV23 vaccine dose should be obtained at least 8 weeks after the dose of PCV13 vaccine. An adult aged 77 years or older who has certain medical conditions and previously received 1 or more doses of PPSV23 vaccine should receive 1 dose of PCV13. The PCV13 vaccine dose should be obtained 1 or more years after the last PPSV23 vaccine dose.  Pneumococcal polysaccharide (PPSV23) vaccine. When PCV13 is also indicated, PCV13 should be obtained first. All adults aged 52 years and older should be immunized. An adult younger than age 49 years who has certain medical conditions should be immunized. Any person who resides in a nursing home or long-term care facility should be immunized. An adult smoker should be immunized. People with an immunocompromised condition and certain other conditions should receive both PCV13 and PPSV23 vaccines. People with human immunodeficiency virus (HIV) infection should be immunized as soon as possible after diagnosis. Immunization during chemotherapy or radiation therapy should be avoided. Routine use of PPSV23 vaccine is not recommended for American Indians, [place]atives, or people younger than 65 years unless there are medical conditions that require PPSV23 vaccine. When indicated, people who have unknown immunization and have no record of immunization should receive PPSV23 vaccine. One-time revaccination 5 years after the first dose of PPSV23 is recommended for people aged 19-64 years who have chronic kidney failure, nephrotic syndrome, asplenia, or immunocompromised conditions. People who received 1-2 doses of PPSV23 before age 50 years should receive another dose of PPSV23 vaccine at age 34 years or later if at least 5 years have passed since the previous dose. Doses of PPSV23 are not needed for people immunized with PPSV23 at or after age 23 years.  Meningococcal vaccine. Adults with asplenia or persistent complement component  deficiencies should receive 2 doses of quadrivalent meningococcal conjugate (MenACWY-D) vaccine. The doses should be obtained at least 2 months apart. Microbiologists working with certain meningococcal bacteria, [employer] recruits, people at risk during an outbreak, and people who travel to or live in countries with a high rate of meningitis should be immunized. A first-year college student up through age 48 years who is living in a residence hall should receive a dose if he did not receive a dose on or after his 16th  birthday. Adults who have certain high-risk conditions should receive one or more doses of vaccine.  Hepatitis A vaccine. Adults who wish to be protected from this disease, have certain high-risk conditions, work with hepatitis A-infected animals, work in hepatitis A research labs, or travel to or work in countries with a high rate of hepatitis A should be immunized. Adults who were previously unvaccinated and who anticipate close contact with an international adoptee during the first 60 days after arrival in the [place] from a country with a high rate of hepatitis A should be immunized.  Hepatitis B vaccine. Adults should be immunized if they wish to be protected from this disease, have certain high-risk conditions, may be exposed to blood or other infectious body fluids, are household contacts or sex partners of hepatitis B positive people, are clients or workers in certain care facilities, or travel to or work in countries with a high rate of hepatitis B.  Haemophilus influenzae type b (Hib) vaccine. A previously unvaccinated person with asplenia or sickle cell disease or having a scheduled splenectomy should receive 1 dose of Hib vaccine. Regardless of previous immunization, a recipient of a hematopoietic stem cell transplant should receive a 3-dose series 6-12 months after his successful transplant. Hib vaccine is not recommended for adults with HIV infection. Preventive Service /  Frequency Ages 48 to 102  Blood pressure check.** / Every 1 to 2 years.  Lipid and cholesterol check.** / Every 5 years beginning at age 82.  Hepatitis C blood test.** / For any individual with known risks for hepatitis C.  Skin self-exam. / Monthly.  Influenza vaccine. / Every year.  Tetanus, diphtheria, and acellular pertussis (Tdap, Td) vaccine.** / Consult your health care provider. 1 dose of Td every 10 years.  Varicella vaccine.** / Consult your health care provider.  HPV vaccine. / 3 doses over 6 months, if 69 or younger.  Measles, mumps, rubella (MMR) vaccine.** / You need at least 1 dose of MMR if you were born in 1957 or later. You may also need a second dose.  Pneumococcal 13-valent conjugate (PCV13) vaccine.** / Consult your health care provider.  Pneumococcal polysaccharide (PPSV23) vaccine.** / 1 to 2 doses if you smoke cigarettes or if you have certain conditions.  Meningococcal vaccine.** / 1 dose if you are age 62 to 52 years and a Market researcher living in a residence hall, or have one of several medical conditions. You may also need additional booster doses.  Hepatitis A vaccine.** / Consult your health care provider.  Hepatitis B vaccine.** / Consult your health care provider.  Haemophilus influenzae type b (Hib) vaccine.** / Consult your health care provider. Ages 53 to 56  Blood pressure check.** / Every 1 to 2 years.  Lipid and cholesterol check.** / Every 5 years beginning at age 2.  Lung cancer screening. / Every year if you are aged 49-80 years and have a 30-pack-year history of smoking and currently smoke or have quit within the past 15 years. Yearly screening is stopped once you have quit smoking for at least 15 years or develop a health problem that would prevent you from having lung cancer treatment.  Fecal occult blood test (FOBT) of stool. / Every year beginning at age 74 and continuing until age 69. You may not have to do this test  if you get a colonoscopy every 10 years.  Flexible sigmoidoscopy** or colonoscopy.** / Every 5 years for a flexible sigmoidoscopy or every 10 years for  a colonoscopy beginning at age 3 and continuing until age 78.  Hepatitis C blood test.** / For all people born from [birth year] through 1965 and any individual with known risks for hepatitis C.  Skin self-exam. / Monthly.  Influenza vaccine. / Every year.  Tetanus, diphtheria, and acellular pertussis (Tdap/Td) vaccine.** / Consult your health care provider. 1 dose of Td every 10 years.  Varicella vaccine.** / Consult your health care provider.  Zoster vaccine.** / 1 dose for adults aged 8 years or older.  Measles, mumps, rubella (MMR) vaccine.** / You need at least 1 dose of MMR if you were born in 1955 or later. You may also need a second dose.  Pneumococcal 13-valent conjugate (PCV13) vaccine.** / Consult your health care provider.  Pneumococcal polysaccharide (PPSV23) vaccine.** / 1 to 2 doses if you smoke cigarettes or if you have certain conditions.  Meningococcal vaccine.** / Consult your health care provider.  Hepatitis A vaccine.** / Consult your health care provider.  Hepatitis B vaccine.** / Consult your health care provider.  Haemophilus influenzae type b (Hib) vaccine.** / Consult your health care provider. Ages 74 and over  Blood pressure check.** / Every 1 to 2 years.  Lipid and cholesterol check.**/ Every 5 years beginning at age 30.  Lung cancer screening. / Every year if you are aged 60-80 years and have a 30-pack-year history of smoking and currently smoke or have quit within the past 15 years. Yearly screening is stopped once you have quit smoking for at least 15 years or develop a health problem that would prevent you from having lung cancer treatment.  Fecal occult blood test (FOBT) of stool. / Every year beginning at age 66 and continuing until age 2. You may not have to do this test if you get a colonoscopy  every 10 years.  Flexible sigmoidoscopy** or colonoscopy.** / Every 5 years for a flexible sigmoidoscopy or every 10 years for a colonoscopy beginning at age 14 and continuing until age 81.  Hepatitis C blood test.** / For all people born from [birth year] through 1963 and any individual with known risks for hepatitis C.  Abdominal aortic aneurysm (AAA) screening.** / A one-time screening for ages 31 to 68 years who are current or former smokers.  Skin self-exam. / Monthly.  Influenza vaccine. / Every year.  Tetanus, diphtheria, and acellular pertussis (Tdap/Td) vaccine.** / 1 dose of Td every 10 years.  Varicella vaccine.** / Consult your health care provider.  Zoster vaccine.** / 1 dose for adults aged 3 years or older.  Pneumococcal 13-valent conjugate (PCV13) vaccine.** / Consult your health care provider.  Pneumococcal polysaccharide (PPSV23) vaccine.** / 1 dose for all adults aged 24 years and older.  Meningococcal vaccine.** / Consult your health care provider.  Hepatitis A vaccine.** / Consult your health care provider.  Hepatitis B vaccine.** / Consult your health care provider.  Haemophilus influenzae type b (Hib) vaccine.** / Consult your health care provider. **Family history and personal history of risk and conditions may change your health care provider's recommendations. Document Released: 04/18/2001 Document Revised: 02/25/2013 Document Reviewed: 07/18/2010 [facility] Patient Information 2015 [place], [place]. This information is not intended to replace advice given to you by your health care provider. Make sure you discuss any questions you have with your health care provider.

## 2014-06-29 ENCOUNTER — Other Ambulatory Visit: Payer: Self-pay | Admitting: Family

## 2014-06-29 NOTE — Telephone Encounter (Signed)
Refill sent per [facility] refill protocol/SLS  

## 2014-09-30 ENCOUNTER — Other Ambulatory Visit: Payer: Self-pay | Admitting: Physician Assistant

## 2014-10-12 ENCOUNTER — Ambulatory Visit: Payer: Medicare HMO | Admitting: Physician Assistant

## 2014-10-14 ENCOUNTER — Telehealth: Payer: Self-pay | Admitting: Physician Assistant

## 2014-10-14 NOTE — Telephone Encounter (Signed)
No charge. 

## 2014-10-14 NOTE — Telephone Encounter (Signed)
Pt was no show 10/12/14 9:15am, 6 month f/u appt, called pt and rescheduled for 8/22, he states he forgot about the appt, charge no show fee?

## 2014-10-15 ENCOUNTER — Telehealth: Payer: Self-pay | Admitting: Physician Assistant

## 2014-10-15 NOTE — Telephone Encounter (Signed)
Talked to pt and verified appt with him.

## 2014-10-15 NOTE — Telephone Encounter (Signed)
Received voicemail 10/15/14 9:10 not specific as to reason for call, tried calling, unable to leave voicemail.

## 2014-10-26 ENCOUNTER — Ambulatory Visit: Payer: Medicare HMO | Admitting: Physician Assistant

## 2014-10-27 ENCOUNTER — Ambulatory Visit (INDEPENDENT_AMBULATORY_CARE_PROVIDER_SITE_OTHER): Payer: Medicare HMO | Admitting: Physician Assistant

## 2014-10-27 ENCOUNTER — Encounter: Payer: Self-pay | Admitting: Physician Assistant

## 2014-10-27 VITALS — BP 128/74 | HR 65 | Temp 97.7°F | Resp 16 | Ht 66.0 in | Wt 145.2 lb

## 2014-10-27 DIAGNOSIS — G4700 Insomnia, unspecified: Secondary | ICD-10-CM

## 2014-10-27 DIAGNOSIS — I10 Essential (primary) hypertension: Secondary | ICD-10-CM

## 2014-10-27 DIAGNOSIS — Z23 Encounter for immunization: Secondary | ICD-10-CM

## 2014-10-27 MED ORDER — SUVOREXANT 10 MG PO TABS: 10.0000 mg | ORAL_TABLET | Freq: Every evening | ORAL | Status: DC | PRN

## 2014-10-27 NOTE — Progress Notes (Signed)
Patient presents to clinic today for follow-up of Hypertension and Insomnia.   Hypertension -- Endorses decreasing lisinopril to 5 mg daily due to some low BP readings at home. Patient denies chest pain, palpitations, lightheadedness, dizziness, vision changes or frequent headaches. Is walking 1 - 1.5 miles per day.  BP Readings from Last 3 Encounters:  10/27/14 128/74  04/08/14 143/81  09/24/13 138/82   Insomnia -- Endorses trying the Ambien with initially good results. Not working well now. Endorses averaging 4-5 hours of sleep. Still feeling tired throughout the day.  Past Medical History  Diagnosis Date  . Hypertension   . History of colonic polyps   . History of nephrolithiasis     lithotripsy  . Elevated PSA     history of elevated   . Skin lesion     of right templs- Dr. Baltazar Najjar    Current Outpatient Prescriptions on File Prior to Visit  Medication Sig Dispense Refill  . diphenhydrAMINE (BENADRYL) 25 MG tablet Take 25 mg by mouth at bedtime as needed.    Marland Kitchen lisinopril (PRINIVIL,ZESTRIL) 10 MG tablet TAKE ONE TABLET BY MOUTH ONCE DAILY (Patient taking differently: TAKE ONE HALF (5 mg) TABLET BY MOUTH ONCE DAILY) 90 tablet 0  . Multiple Vitamin (MULTIVITAMIN) tablet Take 1 tablet by mouth daily.    . naproxen sodium (ANAPROX) 220 MG tablet Take 220 mg by mouth as needed.     No current facility-administered medications on file prior to visit.    No Known Allergies  Family History  Problem Relation Age of Onset  . Breast cancer    . Coronary artery disease      male 1st degree relative died of MI age 11  . Hypertension    . Suicidality Father     father committed suicide whe pt was 58 y/o  . Alcohol abuse Father   . Angina Mother   . Cancer Maternal Aunt     Social History   Social History  . Marital Status: Widowed    Spouse Name: N/A  . Number of Children: 3  . Years of Education: N/A   Occupational History  .     Social History Main Topics  .  Smoking status: Never Smoker   . Smokeless tobacco: Never Used  . Alcohol Use: No  . Drug Use: No  . Sexual Activity: Not Asked   Other Topics Concern  . None   Social History Narrative   Retired - worked as Games developer   Widowed since 2005   2 daughters, 1 son  (son lives nearby)   Never Smoked    Alcohol use-no    Review of Systems - See HPI.  All other ROS are negative.  BP 128/74 mmHg  Pulse 65  Temp(Src) 97.7 F (36.5 C) (Oral)  Resp 16  Ht 5\' 6"  (1.676 m)  Wt 145 lb 4 oz (65.885 kg)  BMI 23.46 kg/m2  SpO2 98%  Physical Exam  Constitutional: He is oriented to person, place, and time and well-developed, well-nourished, and in no distress.  HENT:  Head: Normocephalic and atraumatic.  Eyes: Conjunctivae are normal. Pupils are equal, round, and reactive to light.  Neck: Neck supple.  Cardiovascular: Normal rate, regular rhythm, normal heart sounds and intact distal pulses.   Pulmonary/Chest: Effort normal and breath sounds normal. No respiratory distress. He has no wheezes. He has no rales. He exhibits no tenderness.  Neurological: He is alert and oriented to person, place, and time.  Vitals reviewed.  No results found for this or any previous visit (from the past 2160 hour(s)).  Assessment/Plan: Essential hypertension Stable. Asymptomatic Continue current regimen. Follow-up 6 months.  Insomnia Will begin trial of 10 mg Belsomra. Voucher given. Follow-up via phone in 5 days.

## 2014-10-27 NOTE — Progress Notes (Signed)
Pre visit review using our clinic review tool, if applicable. No additional management support is needed unless otherwise documented below in the visit note/SLS  

## 2014-10-27 NOTE — Assessment & Plan Note (Signed)
Will begin trial of 10 mg Belsomra. Voucher given. Follow-up via phone in 5 days.

## 2014-10-27 NOTE — Patient Instructions (Signed)
Please continue 1/2 tablet of your lisinopril daily.  Continue BP checks periodically. Stay well hydrated and eat a well-balanced diet.  Start the Miles taking as directed. Give me a call next Monday to let me know how it is working. If you notice any side effects of the medication, stop it and call me.  Follow-up 6 months.

## 2014-10-27 NOTE — Addendum Note (Signed)
Addended by: Rockwell Germany on: 10/27/2014 11:55 AM   Modules accepted: Orders

## 2014-10-27 NOTE — Assessment & Plan Note (Signed)
Stable. Asymptomatic.  Continue current regimen. Follow-up 6 months. 

## 2014-10-29 ENCOUNTER — Telehealth: Payer: Self-pay | Admitting: Physician Assistant

## 2014-10-29 NOTE — Telephone Encounter (Signed)
Pt states he is returning call about meds for sleep. Please call bc he said pharmacy did not have meds for him. RX in for Belsomra???

## 2014-10-29 NOTE — Telephone Encounter (Signed)
He was given the prescription along with a voucher. Have him please check the papers I gave him at visit and he should find the prescription and voucher together. I believe I stapled the two together.

## 2014-10-29 NOTE — Telephone Encounter (Signed)
Notified pt. 

## 2014-12-29 DIAGNOSIS — H5213 Myopia, bilateral: Secondary | ICD-10-CM | POA: Diagnosis not present

## 2014-12-29 DIAGNOSIS — Z0100 Encounter for examination of eyes and vision without abnormal findings: Secondary | ICD-10-CM | POA: Diagnosis not present

## 2015-01-13 DIAGNOSIS — Z460 Encounter for fitting and adjustment of spectacles and contact lenses: Secondary | ICD-10-CM | POA: Diagnosis not present

## 2015-04-30 ENCOUNTER — Ambulatory Visit (INDEPENDENT_AMBULATORY_CARE_PROVIDER_SITE_OTHER): Payer: Medicare HMO | Admitting: Physician Assistant

## 2015-04-30 ENCOUNTER — Encounter: Payer: Self-pay | Admitting: Physician Assistant

## 2015-04-30 VITALS — BP 144/78 | HR 61 | Temp 97.8°F | Ht 66.0 in | Wt 151.0 lb

## 2015-04-30 DIAGNOSIS — I10 Essential (primary) hypertension: Secondary | ICD-10-CM

## 2015-04-30 DIAGNOSIS — Z23 Encounter for immunization: Secondary | ICD-10-CM | POA: Diagnosis not present

## 2015-04-30 NOTE — Patient Instructions (Signed)
Please continue checking your BP at home. Follow the diet below to keep BP at a good level.  DASH Eating Plan DASH stands for "Dietary Approaches to Stop Hypertension." The DASH eating plan is a healthy eating plan that has been shown to reduce high blood pressure (hypertension). Additional health benefits may include reducing the risk of type 2 diabetes mellitus, heart disease, and stroke. The DASH eating plan may also help with weight loss. WHAT DO I NEED TO KNOW ABOUT THE DASH EATING PLAN? For the DASH eating plan, you will follow these general guidelines:  Choose foods with a percent daily value for sodium of less than 5% (as listed on the food label).  Use salt-free seasonings or herbs instead of table salt or sea salt.  Check with your health care provider or pharmacist before using salt substitutes.  Eat lower-sodium products, often labeled as "lower sodium" or "no salt added."  Eat fresh foods.  Eat more vegetables, fruits, and low-fat dairy products.  Choose whole grains. Look for the word "whole" as the first word in the ingredient list.  Choose fish and skinless chicken or [place] more often than red meat. Limit fish, poultry, and meat to 6 oz (170 g) each day.  Limit sweets, desserts, sugars, and sugary drinks.  Choose heart-healthy fats.  Limit cheese to 1 oz (28 g) per day.  Eat more home-cooked food and less restaurant, buffet, and fast food.  Limit fried foods.  Cook foods using methods other than frying.  Limit canned vegetables. If you do use them, rinse them well to decrease the sodium.  When eating at a restaurant, ask that your food be prepared with less salt, or no salt if possible. WHAT FOODS CAN I EAT? Seek help from a dietitian for individual calorie needs. Grains Whole grain or whole wheat bread. Brown rice. Whole grain or whole wheat pasta. Quinoa, bulgur, and whole grain cereals. Low-sodium cereals. Corn or whole wheat flour tortillas. Whole grain  cornbread. Whole grain crackers. Low-sodium crackers. Vegetables Fresh or frozen vegetables (raw, steamed, roasted, or grilled). Low-sodium or reduced-sodium tomato and vegetable juices. Low-sodium or reduced-sodium tomato sauce and paste. Low-sodium or reduced-sodium canned vegetables.  Fruits All fresh, canned (in natural juice), or frozen fruits. Meat and Other Protein Products Ground beef (85% or leaner), grass-fed beef, or beef trimmed of fat. Skinless chicken or [place]. Ground chicken or [place]. Pork trimmed of fat. All fish and seafood. Eggs. Dried beans, peas, or lentils. Unsalted nuts and seeds. Unsalted canned beans. Dairy Low-fat dairy products, such as skim or 1% milk, 2% or reduced-fat cheeses, low-fat ricotta or cottage cheese, or plain low-fat yogurt. Low-sodium or reduced-sodium cheeses. Fats and Oils Tub margarines without trans fats. Light or reduced-fat mayonnaise and salad dressings (reduced sodium). Avocado. Safflower, olive, or canola oils. Natural peanut or almond butter. Other Unsalted popcorn and pretzels. The items listed above may not be a complete list of recommended foods or beverages. Contact your dietitian for more options. WHAT FOODS ARE NOT RECOMMENDED? Grains White bread. White pasta. White rice. Refined cornbread. Bagels and croissants. Crackers that contain trans fat. Vegetables Creamed or fried vegetables. Vegetables in a cheese sauce. Regular canned vegetables. Regular canned tomato sauce and paste. Regular tomato and vegetable juices. Fruits Dried fruits. Canned fruit in light or heavy syrup. Fruit juice. Meat and Other Protein Products Fatty cuts of meat. Ribs, chicken wings, bacon, sausage, bologna, salami, chitterlings, fatback, hot dogs, bratwurst, and packaged luncheon meats. Salted nuts and seeds.  Canned beans with salt. Dairy Whole or 2% milk, cream, half-and-half, and cream cheese. Whole-fat or sweetened yogurt. Full-fat cheeses or blue cheese.  Nondairy creamers and whipped toppings. Processed cheese, cheese spreads, or cheese curds. Condiments Onion and garlic salt, seasoned salt, table salt, and sea salt. Canned and packaged gravies. Worcestershire sauce. Tartar sauce. Barbecue sauce. Teriyaki sauce. Soy sauce, including reduced sodium. Steak sauce. Fish sauce. Oyster sauce. Cocktail sauce. Horseradish. Ketchup and mustard. Meat flavorings and tenderizers. Bouillon cubes. Hot sauce. Tabasco sauce. Marinades. Taco seasonings. Relishes. Fats and Oils Butter, stick margarine, lard, shortening, ghee, and bacon fat. Coconut, palm kernel, or palm oils. Regular salad dressings. Other Pickles and olives. Salted popcorn and pretzels. The items listed above may not be a complete list of foods and beverages to avoid. Contact your dietitian for more information. WHERE CAN I FIND MORE INFORMATION? National Heart, Lung, and Blood Institute: travelstabloid.com   This information is not intended to replace advice given to you by your health care provider. Make sure you discuss any questions you have with your health care provider.   Document Released: 02/09/2011 Document Revised: 03/13/2014 Document Reviewed: 12/25/2012 Elsevier Interactive Patient Education [employer].

## 2015-04-30 NOTE — Assessment & Plan Note (Signed)
Patient stopped medication due to fatigue with medication. Has been watching diet. Denies symptoms. Endorses home BP averaging 130-140/80s. Has log for review which is consistent with his endorsements. Repeat BP at 144/78. Giving age and lack of comorbidity, will stay off medication and begin DASH diet. Will monitor BP at future appointments.

## 2015-04-30 NOTE — Progress Notes (Signed)
   Patient presents to clinic today for 6 month follow-up of hypertension. Was prescribed lisinopril 10 mg daily. Patient endorses stopping medication due to feeling bad on medication. Endorses checking BP at home with averages ranging 130-140/80s. Patient denies chest pain, palpitations, lightheadedness, dizziness, vision changes or frequent headaches.   Past Medical History  Diagnosis Date  . Hypertension   . History of colonic polyps   . History of nephrolithiasis     lithotripsy  . Elevated PSA     history of elevated   . Skin lesion     of right templs- Dr. Baltazar Najjar    Current Outpatient Prescriptions on File Prior to Visit  Medication Sig Dispense Refill  . Multiple Vitamin (MULTIVITAMIN) tablet Take 1 tablet by mouth daily.    . diphenhydrAMINE (BENADRYL) 25 MG tablet Take 25 mg by mouth at bedtime as needed. Reported on 04/30/2015    . naproxen sodium (ANAPROX) 220 MG tablet Take 220 mg by mouth as needed. Reported on 04/30/2015     No current facility-administered medications on file prior to visit.    No Known Allergies  Family History  Problem Relation Age of Onset  . Breast cancer    . Coronary artery disease      male 1st degree relative died of MI age 69  . Hypertension    . Suicidality Father     father committed suicide whe pt was 59 y/o  . Alcohol abuse Father   . Angina Mother   . Cancer Maternal Aunt     Social History   Social History  . Marital Status: Widowed    Spouse Name: N/A  . Number of Children: 3  . Years of Education: N/A   Occupational History  .     Social History Main Topics  . Smoking status: Never Smoker   . Smokeless tobacco: Never Used  . Alcohol Use: No  . Drug Use: No  . Sexual Activity: Not Asked   Other Topics Concern  . None   Social History Narrative   Retired - worked as Games developer   Widowed since 2005   2 daughters, 1 son  (son lives nearby)   Never Smoked    Alcohol use-no    Review of Systems - See HPI.  All  other ROS are negative.  BP 144/78 mmHg  Pulse 61  Temp(Src) 97.8 F (36.6 C) (Oral)  Ht 5\' 6"  (1.676 m)  Wt 151 lb (68.493 kg)  BMI 24.38 kg/m2  SpO2 98%  Physical Exam  Constitutional: He is oriented to person, place, and time.  Cardiovascular: Normal rate, regular rhythm, normal heart sounds and intact distal pulses.   Pulmonary/Chest: Effort normal and breath sounds normal. No respiratory distress. He has no wheezes. He has no rales. He exhibits no tenderness.  Neurological: He is alert and oriented to person, place, and time.  Skin: Skin is warm and dry. No rash noted.  Psychiatric: Affect normal.  Vitals reviewed.  No results found for this or any previous visit (from the past 2160 hour(s)).  Assessment/Plan: Essential hypertension Patient stopped medication due to fatigue with medication. Has been watching diet. Denies symptoms. Endorses home BP averaging 130-140/80s. Has log for review which is consistent with his endorsements. Repeat BP at 144/78. Giving age and lack of comorbidity, will stay off medication and begin DASH diet. Will monitor BP at future appointments.

## 2015-04-30 NOTE — Progress Notes (Signed)
Pre visit review using our clinic review tool, if applicable. No additional management support is needed unless otherwise documented below in the visit note. 

## 2015-06-11 ENCOUNTER — Ambulatory Visit (INDEPENDENT_AMBULATORY_CARE_PROVIDER_SITE_OTHER): Payer: Medicare HMO | Admitting: Medical

## 2015-06-11 ENCOUNTER — Encounter: Payer: Self-pay | Admitting: Medical

## 2015-06-11 VITALS — BP 124/84 | HR 60 | Temp 98.0°F | Ht 66.0 in | Wt 150.0 lb

## 2015-06-11 DIAGNOSIS — H8110 Benign paroxysmal vertigo, unspecified ear: Secondary | ICD-10-CM

## 2015-06-11 DIAGNOSIS — M542 Cervicalgia: Secondary | ICD-10-CM

## 2015-06-11 NOTE — Progress Notes (Signed)
Subjective:    Patient ID: Derrick Schultz, male    DOB: 03/27/1930, 82 y.o.   MRN: [id number]  HPI  Pt in with neck pain on and off for more than a year per pt. He actually states pain for 20 yrs on and off mild + intemrittent.   Pt does have some sever degenerative disk disease(by CT neck in 2015). Evidence of some stenosis both on CT. Pt  States pain is mild at times. But last 3 days pain was more severe. This am was moderate but now eased up.   At times with certain movements in bed will feel sharp pain in left side of his neck. No radicular pain to arms. Pt states he will have to find comfortable position but then will move and feel sharp pain again.  Occasional rare vertigo sensation in bed. Not every day. Describes rare but some today in am. But on exam lying supine and turning head no dizziness or vertigo. No associated neurologic signs or symptoms.  Pt is only on ibuprofen. He only takes it about one time a week. Usually take it only at night.  Pt states with ibuprofen will control his pain. He only takes one tablet.    Review of Systems  Constitutional: Negative for fever, chills and fatigue.  Respiratory: Negative for cough, chest tightness, shortness of breath and wheezing.   Cardiovascular: Negative for chest pain and palpitations.  Gastrointestinal: Negative for abdominal pain.  Musculoskeletal: Positive for back pain.  Skin: Negative for rash.  Neurological: Positive for dizziness. Negative for tremors, seizures, facial asymmetry, speech difficulty, light-headedness, numbness and headaches.       Rare vertigo.  Hematological: Negative for adenopathy. Does not bruise/bleed easily.  Psychiatric/Behavioral: Negative for suicidal ideas, confusion, self-injury, dysphoric mood and agitation. The patient is not nervous/anxious.     Past Medical History  Diagnosis Date  . Hypertension   . History of colonic polyps   . History of nephrolithiasis     lithotripsy  .  Elevated PSA     history of elevated   . Skin lesion     of right templs- Dr. Baltazar Najjar    Social History   Social History  . Marital Status: Widowed    Spouse Name: N/A  . Number of Children: 3  . Years of Education: N/A   Occupational History  .     Social History Main Topics  . Smoking status: Never Smoker   . Smokeless tobacco: Never Used  . Alcohol Use: No  . Drug Use: No  . Sexual Activity: Not on file   Other Topics Concern  . Not on file   Social History Narrative   Retired - worked as Games developer   Widowed since 2005   2 daughters, 1 son  (son lives nearby)   Never Smoked    Alcohol use-no     Past Surgical History  Procedure Laterality Date  . Colonoscopy      x 2 intial showed polyps  . Hernia repair      right inguinal    Family History  Problem Relation Age of Onset  . Breast cancer    . Coronary artery disease      male 1st degree relative died of MI age 81  . Hypertension    . Suicidality Father     father committed suicide whe pt was 80 y/o  . Alcohol abuse Father   . Angina Mother   . Cancer Maternal Aunt  No Known Allergies  Current Outpatient Prescriptions on File Prior to Visit  Medication Sig Dispense Refill  . diphenhydrAMINE (BENADRYL) 25 MG tablet Take 25 mg by mouth at bedtime as needed. Reported on 04/30/2015    . ibuprofen (ADVIL,MOTRIN) 200 MG tablet Take 200 mg by mouth daily.    . Multiple Vitamin (MULTIVITAMIN) tablet Take 1 tablet by mouth daily.    . naproxen sodium (ANAPROX) 220 MG tablet Take 220 mg by mouth as needed. Reported on 04/30/2015     No current facility-administered medications on file prior to visit.    BP 124/84 mmHg  Pulse 60  Temp(Src) 98 F (36.7 C) (Oral)  Ht 5\' 6"  (1.676 m)  Wt 150 lb (68.04 kg)  BMI 24.22 kg/m2  SpO2 97%       Objective:   Physical Exam  General Mental Status- Alert. General Appearance- Not in acute distress.   Skin General: Color- Normal Color. Moisture- Normal  Moisture.  Neck Carotid Arteries- Normal color. Moisture- Normal Moisture. No carotid bruits. No JVD. No mid cspine tenderness. But mild left trapezius tenderness. Chest and Lung Exam Auscultation: Breath Sounds:-Normal.  Cardiovascular Auscultation:Rythm- Regular. Murmurs & Other Heart Sounds:Auscultation of the heart reveals- No Murmurs.  Abdomen Inspection:-Inspeection Normal. Palpation/Percussion:Note:No mass. Palpation and Percussion of the abdomen reveal- Non Tender, Non Distended + BS, no rebound or guarding.    Neurologic Cranial Nerve exam:- CN III-XII intact(No nystagmus), symmetric smile. Strength:- 5/5 equal and symmetric strength both upper and lower extremities. On exam lying supine no dizziness. No vertigo(none on head maneuver)      Assessment & Plan:  Your neck pain is likely combination of degenerative type pain and trapezius strain pain. Advise you to take ibuprofen 200 mg- 400 mg at night. If at time pain exceeds this might consider low dose pain medication such as tramadol.  I want to know if you are getting vertigo on regular basis with head turns. If so will refer you to PT. If you get dizziness with other symptoms as discussed then ED evaluation.  Follow up in 10-14 days or as needed

## 2015-06-11 NOTE — Patient Instructions (Signed)
Your neck pain is likely combination of degenerative type pain and trapezius strain pain. Advise you to take ibuprofen 200 mg- 400 mg at night. If at time pain exceeds this might consider low dose pain medication such as tramadol.  I want to know if you are getting vertigo on regular basis with head turns. If so will refer you to PT. If you get dizziness with other symptoms as discussed then ED evaluation.  Follow up in 10-14 days or as needed

## 2015-06-11 NOTE — Progress Notes (Signed)
Pre visit review using our clinic review tool, if applicable. No additional management support is needed unless otherwise documented below in the visit note. 

## 2015-08-27 DIAGNOSIS — L570 Actinic keratosis: Secondary | ICD-10-CM | POA: Diagnosis not present

## 2015-08-27 DIAGNOSIS — Z85828 Personal history of other malignant neoplasm of skin: Secondary | ICD-10-CM | POA: Diagnosis not present

## 2015-08-27 DIAGNOSIS — Z08 Encounter for follow-up examination after completed treatment for malignant neoplasm: Secondary | ICD-10-CM | POA: Diagnosis not present

## 2015-11-02 ENCOUNTER — Ambulatory Visit (INDEPENDENT_AMBULATORY_CARE_PROVIDER_SITE_OTHER): Payer: Medicare HMO | Admitting: Physician Assistant

## 2015-11-02 ENCOUNTER — Encounter: Payer: Self-pay | Admitting: Physician Assistant

## 2015-11-02 VITALS — BP 148/84 | HR 54 | Temp 97.6°F | Resp 16 | Ht 66.0 in | Wt 144.5 lb

## 2015-11-02 DIAGNOSIS — R351 Nocturia: Secondary | ICD-10-CM | POA: Diagnosis not present

## 2015-11-02 DIAGNOSIS — I10 Essential (primary) hypertension: Secondary | ICD-10-CM

## 2015-11-02 DIAGNOSIS — E785 Hyperlipidemia, unspecified: Secondary | ICD-10-CM | POA: Diagnosis not present

## 2015-11-02 DIAGNOSIS — N400 Enlarged prostate without lower urinary tract symptoms: Secondary | ICD-10-CM | POA: Diagnosis not present

## 2015-11-02 LAB — POCT URINALYSIS DIPSTICK
BILIRUBIN UA: NEGATIVE
Blood, UA: NEGATIVE
Glucose, UA: NEGATIVE
KETONES UA: NEGATIVE
Leukocytes, UA: NEGATIVE
Nitrite, UA: NEGATIVE
PH UA: 6
Protein, UA: NEGATIVE
Spec Grav, UA: 1.02
Urobilinogen, UA: 0.2

## 2015-11-02 LAB — LIPID PANEL
Cholesterol: 149 mg/dL (ref 0–200)
HDL: 30.6 mg/dL — ABNORMAL LOW (ref >39.00)
LDL CALC: 92 mg/dL (ref 0–99)
NonHDL: 118.84
Total CHOL/HDL Ratio: 5
Triglycerides: 133 mg/dL (ref 0.0–149.0)
VLDL: 26.6 mg/dL (ref 0.0–40.0)

## 2015-11-02 MED ORDER — ZOLPIDEM TARTRATE 5 MG PO TABS: 2.5000 mg | ORAL_TABLET | Freq: Every evening | ORAL | 0 refills | Status: DC | PRN

## 2015-11-02 NOTE — Progress Notes (Signed)
Patient presents to clinic today for follow-up of hypertension. At last visit patient was taken off of lisinopril due to fatigue. Patient wanted to work hard on lifestyle changes. Agreed we would attempt trial off of medication with DASH diet. He was to check BP periodically at home and return if elevated greater than goal. Patient endorses well-balanced diet overall. Is staying active -- walking for an hour each day and working around the home. Patient denies chest pain, palpitations, lightheadedness, dizziness, vision changes or frequent headaches.  BP Readings from Last 3 Encounters:  11/02/15 (!) 148/84  06/11/15 124/84  04/30/15 (!) 144/78   Patient is requesting a refill of zolpidem. Has been on 2.5 mg nightly only used on rare occasions if he has gone without sleep. Denies side effect of medication.  Patient also notes nocturia x 3-4. Endorses some mild urinary leakage at nighttime. Denies urinary urgency, frequency or dysuria. Patient with history of BPH diagnosed in 22s with elevated PSA. Denies any treatment for this.   Past Medical History:  Diagnosis Date  . Elevated PSA    history of elevated   . History of colonic polyps   . History of nephrolithiasis    lithotripsy  . Hypertension   . Skin lesion    of right templs- Dr. Baltazar Najjar    Current Outpatient Prescriptions on File Prior to Visit  Medication Sig Dispense Refill  . Multiple Vitamin (MULTIVITAMIN) tablet Take 1 tablet by mouth daily.     No current facility-administered medications on file prior to visit.     No Known Allergies  Family History  Problem Relation Age of Onset  . Breast cancer    . Coronary artery disease      male 1st degree relative died of MI age 59  . Hypertension    . Suicidality Father     father committed suicide whe pt was 57 y/o  . Alcohol abuse Father   . Angina Mother   . Cancer Maternal Aunt     Social History   Social History  . Marital status: Widowed    Spouse name:  N/A  . Number of children: 3  . Years of education: N/A   Occupational History  .  Retired   Social History Main Topics  . Smoking status: Never Smoker  . Smokeless tobacco: Never Used  . Alcohol use No  . Drug use: No  . Sexual activity: Not Asked   Other Topics Concern  . None   Social History Narrative   Retired - worked as Games developer   Widowed since 2005   2 daughters, 1 son  (son lives nearby)   Never Smoked    Alcohol use-no    Review of Systems - See HPI.  All other ROS are negative.  BP (!) 148/84 (BP Location: Left Arm, Patient Position: Sitting, Cuff Size: Normal)   Pulse (!) 54   Temp 97.6 F (36.4 C) (Oral)   Resp 16   Ht 5\' 6"  (1.676 m)   Wt 144 lb 8 oz (65.5 kg)   SpO2 99%   BMI 23.32 kg/m   Physical Exam  Constitutional: He is oriented to person, place, and time and well-developed, well-nourished, and in no distress.  HENT:  Head: Normocephalic and atraumatic.  Eyes: Conjunctivae are normal.  Neck: Neck supple.  Cardiovascular: Normal rate, regular rhythm, normal heart sounds and intact distal pulses.   Pulmonary/Chest: Effort normal and breath sounds normal. No respiratory distress. He has no wheezes.  He has no rales. He exhibits no tenderness.  Neurological: He is alert and oriented to person, place, and time.  Skin: Skin is warm and dry. No rash noted.  Psychiatric: Affect normal.  Vitals reviewed.  Assessment/Plan: Essential hypertension BP acceptable giving age and lack of comorbidity.  Asymptomatic. Continue DASH diet. FU scheduled.  BPH (benign prostatic hyperplasia) + history. Patient with nocturia. Urine dip within normal limits. Referral placed to Urology for further management giving age.   Hyperlipidemia Will check fasting labs today.    Leeanne Rio, PA-C

## 2015-11-02 NOTE — Assessment & Plan Note (Signed)
BP acceptable giving age and lack of comorbidity.  Asymptomatic. Continue DASH diet. FU scheduled.

## 2015-11-02 NOTE — Assessment & Plan Note (Addendum)
+   history. Patient with nocturia. Urine dip within normal limits. Referral placed to Urology for further management giving age.

## 2015-11-02 NOTE — Patient Instructions (Signed)
Please go to the lab for blood work. Continue good diet and exercise. You will be contacted by urology for further assessment of symptoms.  Follow-up in 6 months for a complete physical.

## 2015-11-02 NOTE — Assessment & Plan Note (Signed)
Will check fasting labs today.  

## 2015-11-16 DIAGNOSIS — N401 Enlarged prostate with lower urinary tract symptoms: Secondary | ICD-10-CM | POA: Diagnosis not present

## 2015-11-16 DIAGNOSIS — R351 Nocturia: Secondary | ICD-10-CM | POA: Diagnosis not present

## 2015-11-30 DIAGNOSIS — N401 Enlarged prostate with lower urinary tract symptoms: Secondary | ICD-10-CM | POA: Diagnosis not present

## 2015-11-30 DIAGNOSIS — R351 Nocturia: Secondary | ICD-10-CM | POA: Diagnosis not present

## 2015-12-17 DIAGNOSIS — R69 Illness, unspecified: Secondary | ICD-10-CM | POA: Diagnosis not present

## 2015-12-21 DIAGNOSIS — H2513 Age-related nuclear cataract, bilateral: Secondary | ICD-10-CM | POA: Diagnosis not present

## 2015-12-21 DIAGNOSIS — R69 Illness, unspecified: Secondary | ICD-10-CM | POA: Diagnosis not present

## 2016-01-19 ENCOUNTER — Telehealth: Payer: Self-pay | Admitting: Physician Assistant

## 2016-01-19 NOTE — Telephone Encounter (Signed)
I don't routinely rx Ambien long term. As long as he knows, this, I am happy to see him.

## 2016-01-19 NOTE — Telephone Encounter (Signed)
Fine with me

## 2016-01-19 NOTE — Telephone Encounter (Signed)
Pt would like to switch providers due to his current provider leaving [facility] office. Pt says that [place] is to far to travel.   Is switch okay?    CB: [phone]

## 2016-01-25 NOTE — Telephone Encounter (Signed)
Called pt. Made him aware of providers concern. Pt says that he is okay with provider decision. Pt also stated that he doesn't take the Ambien all of the time just sometimes when he have trouble sleeping.   Pt has been scheduled.

## 2016-02-01 ENCOUNTER — Telehealth: Payer: Self-pay | Admitting: *Deleted

## 2016-02-01 ENCOUNTER — Encounter: Payer: Self-pay | Admitting: *Deleted

## 2016-02-01 NOTE — Telephone Encounter (Signed)
Pre-Visit Call completed with patient and chart updated.   Pre-Visit Info documented in Specialty Comments under [employer].    

## 2016-02-02 ENCOUNTER — Encounter: Payer: Self-pay | Admitting: Family Medicine

## 2016-02-02 ENCOUNTER — Ambulatory Visit (INDEPENDENT_AMBULATORY_CARE_PROVIDER_SITE_OTHER): Payer: Medicare HMO | Admitting: Family Medicine

## 2016-02-02 VITALS — BP 136/78 | HR 58 | Temp 97.5°F | Ht 66.0 in | Wt 148.8 lb

## 2016-02-02 DIAGNOSIS — M542 Cervicalgia: Secondary | ICD-10-CM

## 2016-02-02 DIAGNOSIS — G8929 Other chronic pain: Secondary | ICD-10-CM

## 2016-02-02 NOTE — Progress Notes (Signed)
Musculoskeletal Exam  Patient: Derrick Schultz DOB: [birth year]  DOS: 02/02/2016  SUBJECTIVE:  Chief Complaint:   Chief Complaint  Patient presents with  . Establish Care    Pt reports neck pain     Derrick Schultz is a 82 y.o.  male for evaluation and treatment of chronic neck pain.   Onset: Many years ago, around 42 yrs Location: L side Character:  aching  Progression of issue:  Waxes and wanes Associated symptoms: if he moves his  Treatment: to date has been rest and traction. The device apply traction to his neck has worn out is been getting another one. This helped the most. Neurovascular symptoms: no  ROS: Musculoskeletal/Extremities: +neck pain Neurologic: no numbness, tingling no weakness   Past Medical History:  Diagnosis Date  . Elevated PSA    history of elevated   . History of colonic polyps   . History of nephrolithiasis    lithotripsy  . Hypertension   . Skin lesion    of right templs- Dr. Baltazar Najjar   Past Surgical History:  Procedure Laterality Date  . COLONOSCOPY     x 2 intial showed polyps  . HERNIA REPAIR     right inguinal   Family History  Problem Relation Age of Onset  . Suicidality Father     father committed suicide whe pt was 59 y/o  . Alcohol abuse Father   . Angina Mother   . Breast cancer    . Coronary artery disease      male 1st degree relative died of MI age 66  . Hypertension    . Cancer Maternal Aunt    Current Outpatient Prescriptions  Medication Sig Dispense Refill  . ibuprofen (ADVIL,MOTRIN) 200 MG tablet Take 200 mg by mouth every 6 (six) hours as needed.    . Multiple Vitamin (MULTIVITAMIN) tablet Take 1 tablet by mouth daily.    . tamsulosin (FLOMAX) 0.4 MG CAPS capsule Take 0.4 mg by mouth daily.     No Known Allergies Social History   Social History  . Marital status: Widowed  . Number of children: 3   Occupational History  .  Retired   Social History Main Topics  . Smoking status: Never Smoker  .  Smokeless tobacco: Never Used  . Alcohol use No  . Drug use: No   Social History Narrative   Retired - worked as Games developer   Widowed since 2005   2 daughters, 1 son  (son lives nearby)   Never Smoked    Alcohol use-no     Objective: VITAL SIGNS: BP 136/78 (BP Location: Left Arm, Patient Position: Sitting, Cuff Size: Small)   Pulse (!) 58   Temp 97.5 F (36.4 C) (Oral)   Ht 5\' 6"  (1.676 m)   Wt 148 lb 12.8 oz (67.5 kg)   SpO2 98%   BMI 24.02 kg/m  Constitutional: Well formed, well developed. No acute distress. Cardiovascular: Brisk cap refill, RRR, no murmur Thorax & Lungs: No accessory muscle use, CTAB Skin: Warm. Dry. No erythema. No rash.  Musculoskeletal: neck.   Normal active range of motion: no.   Normal passive range of motion: no Tenderness to palpation: no Deformity: no Ecchymosis: no Tests negative: Spurling's Neurologic: Normal sensory function. No focal deficits noted. DTR's equal and symmetry in UE's. No clonus. Psychiatric: Normal mood. Age appropriate judgment and insight. Alert & oriented x 3.    Assessment:  Chronic neck pain  Plan: Offered PT and referral for  injections.  He would like to try the traction first. His symptoms are tolerable as he is retired. I also told him that I do not prescribe Ambien, he is okay with this and is okay coming off of it. If he needs anything for help, will prescribe a lower dose of trazodone. F/u prn. The patient voiced understanding and agreement to the plan.  [place], DO 02/02/16  10:00 AM

## 2016-02-02 NOTE — Patient Instructions (Signed)
Try traction again. If things are where you'd like them to be after using traction, let our office know.

## 2016-02-02 NOTE — Progress Notes (Signed)
Pre visit review using our clinic review tool, if applicable. No additional management support is needed unless otherwise documented below in the visit note. 

## 2016-02-10 ENCOUNTER — Ambulatory Visit: Payer: Medicare HMO | Admitting: Family Medicine

## 2016-04-20 ENCOUNTER — Ambulatory Visit (INDEPENDENT_AMBULATORY_CARE_PROVIDER_SITE_OTHER): Payer: Medicare HMO | Admitting: Family Medicine

## 2016-04-20 ENCOUNTER — Encounter: Payer: Self-pay | Admitting: Family Medicine

## 2016-04-20 VITALS — BP 106/79 | HR 63 | Temp 97.6°F | Ht 66.0 in | Wt 148.8 lb

## 2016-04-20 DIAGNOSIS — R0989 Other specified symptoms and signs involving the circulatory and respiratory systems: Secondary | ICD-10-CM | POA: Diagnosis not present

## 2016-04-20 DIAGNOSIS — Z0000 Encounter for general adult medical examination without abnormal findings: Secondary | ICD-10-CM

## 2016-04-20 DIAGNOSIS — E785 Hyperlipidemia, unspecified: Secondary | ICD-10-CM | POA: Diagnosis not present

## 2016-04-20 LAB — LIPID PANEL
CHOL/HDL RATIO: 4
Cholesterol: 148 mg/dL (ref 0–200)
HDL: 34.6 mg/dL — AB (ref >39.00)
LDL Cholesterol: 89 mg/dL (ref 0–99)
NONHDL: 113.37
TRIGLYCERIDES: 120 mg/dL (ref 0.0–149.0)
VLDL: 24 mg/dL (ref 0.0–40.0)

## 2016-04-20 LAB — COMPREHENSIVE METABOLIC PANEL
ALK PHOS: 63 U/L (ref 39–117)
ALT: 11 U/L (ref 0–53)
AST: 17 U/L (ref 0–37)
Albumin: 4.1 g/dL (ref 3.5–5.2)
BUN: 14 mg/dL (ref 6–23)
CO2: 26 meq/L (ref 19–32)
Calcium: 9.2 mg/dL (ref 8.4–10.5)
Chloride: 108 mEq/L (ref 96–112)
Creatinine, Ser: 1.04 mg/dL (ref 0.40–1.50)
GFR: 72.44 mL/min (ref >60.00)
GLUCOSE: 90 mg/dL (ref 70–99)
POTASSIUM: 4.1 meq/L (ref 3.5–5.1)
Sodium: 138 mEq/L (ref 135–145)
Total Bilirubin: 0.6 mg/dL (ref 0.2–1.2)
Total Protein: 7 g/dL (ref 6.0–8.3)

## 2016-04-20 LAB — CBC
HCT: 49.4 % (ref 39.0–52.0)
HEMOGLOBIN: 17 g/dL (ref 13.0–17.0)
MCHC: 34.4 g/dL (ref 30.0–36.0)
MCV: 91.3 fl (ref 78.0–100.0)
Platelets: 174 10*3/uL (ref 150.0–400.0)
RBC: 5.41 Mil/uL (ref 4.22–5.81)
RDW: 13.4 % (ref 11.5–15.5)
WBC: 6.6 10*3/uL (ref 4.0–10.5)

## 2016-04-20 NOTE — Patient Instructions (Addendum)
Ask your insurance company about coverage for the tetanus shot and pneumonia vaccine (PCV23 specifically).  Keep up good work with walking.

## 2016-04-20 NOTE — Progress Notes (Signed)
Pre visit review using our clinic review tool, if applicable. No additional management support is needed unless otherwise documented below in the visit note. 

## 2016-04-20 NOTE — Addendum Note (Signed)
Addended by: Corning Cellar on: 04/20/2016 10:57 AM   Modules accepted: Orders

## 2016-04-20 NOTE — Progress Notes (Signed)
Chief Complaint  Patient presents with  . Annual Exam    Well Male Derrick Schultz is here for a complete physical.   His last physical was >1 year ago.  Current diet: in general, a "healthy" diet   Current exercise: walking Weight trend: stable Does pt snore? No. Seat belt? Yes.    Health maintenance PCV13- 2016 Flu- UTD Unsure about tetanus and PCV 23. He will contact his insurance company.  Past Medical History:  Diagnosis Date  . Elevated PSA    history of elevated   . History of colonic polyps   . History of nephrolithiasis    lithotripsy  . Hypertension   . Skin lesion    of right templs- Dr. Baltazar Najjar    Past Surgical History:  Procedure Laterality Date  . COLONOSCOPY     x 2 intial showed polyps  . HERNIA REPAIR     right inguinal   Medications  Current Outpatient Prescriptions on File Prior to Visit  Medication Sig Dispense Refill  . Multiple Vitamin (MULTIVITAMIN) tablet Take 1 tablet by mouth daily.    . tamsulosin (FLOMAX) 0.4 MG CAPS capsule Take 0.4 mg by mouth daily.     Allergies No Known Allergies Family History Family History  Problem Relation Age of Onset  . Suicidality Father     father committed suicide whe pt was 80 y/o  . Alcohol abuse Father   . Angina Mother   . Breast cancer    . Coronary artery disease      male 1st degree relative died of MI age 81  . Hypertension    . Cancer Maternal Aunt     Review of Systems: Constitutional:  no unexpected change in weight, no fevers or chills Eye:  no recent significant change in vision Ear/Nose/Mouth/Throat:  Ears:  no tinnitus or hearing loss Nose/Mouth/Throat:  no complaints of nasal congestion or bleeding, no sore throat and oral sores Cardiovascular:  no chest pain, no palpitations Respiratory:  no cough and no shortness of breath Gastrointestinal:  no abdominal pain, no change in bowel habits, no nausea, vomiting, diarrhea, or constipation and no black or bloody stool GU:  Male:  negative for dysuria, frequency, and incontinence and negative for prostate symptoms Musculoskeletal/Extremities:  +Chronic left sided neck pain, otherwise no pain, redness, or swelling of the joints Integumentary (Skin/Breast):  no abnormal skin lesions reported (follows with dermatology yearly) Neurologic:  no headaches, no numbness, tingling Endocrine:  weight changes, masses in the neck, heat/cold intolerance, bowel or skin changes, or cardiovascular system symptoms Hematologic/Lymphatic:  no abnormal bleeding, no HIV risk factors, no night sweats, no swollen nodes, no weight loss  Exam BP 106/79 (BP Location: Left Arm, Patient Position: Sitting, Cuff Size: Small)   Pulse 63   Temp 97.6 F (36.4 C) (Oral)   Ht 5\' 6"  (1.676 m)   Wt 148 lb 12.8 oz (67.5 kg)   SpO2 98%   BMI 24.02 kg/m  General:  well developed, well nourished, in no apparent distress Skin:  no significant moles, warts, or growths Head:  no masses, lesions, or tenderness Eyes:  pupils equal and round, sclera anicteric without injection Ears:  canals without lesions, TMs shiny without retraction, no obvious effusion, no erythema Nose:  nares patent, septum midline, mucosa normal Throat/Pharynx:  lips and gingiva without lesion; tongue and uvula midline; non-inflamed pharynx; no exudates or postnasal drainage Neck: neck supple without adenopathy, thyromegaly, or masses Lungs:  clear to auscultation, breath sounds  equal bilaterally, no respiratory distress Cardio:  regular rate and rhythm without murmurs, heart sounds without clicks or rubs Abdomen:  abdomen soft, nontender; bowel sounds normal; no masses or organomegaly, the abd aortic pulse is prominent Genital (male): Uncircumcised penis, no lesions or discharge; testes present bilaterally without masses or tenderness Rectal: Deferred Musculoskeletal:  symmetrical muscle groups noted without atrophy or deformity Extremities:  no clubbing, cyanosis, or edema, no  deformities, no skin discoloration Neuro:  gait normal; deep tendon reflexes normal and symmetric Psych: well oriented with normal range of affect and appropriate judgment/insight  Assessment and Plan  Well adult exam - Plan: Comprehensive metabolic panel, CBC  Hyperlipidemia, unspecified hyperlipidemia type - Plan: Lipid panel  Prominent abdominal aortic pulse - Plan: US ABDOMINAL AORTA SCREENING AAA   Well 83 y.o. male. Counseled on diet and exercise. [place] given prominent AA pulse. Will r/o aneurysm of significance.  Pt will check to see if he has had the PCV23 and tetanus with insurance and his records, other labs and further orders as above. Follow up in 1 year pending the above workup. The patient voiced understanding and agreement to the plan.  [place], DO 04/20/16 9:38 AM

## 2016-04-21 ENCOUNTER — Ambulatory Visit (HOSPITAL_BASED_OUTPATIENT_CLINIC_OR_DEPARTMENT_OTHER)
Admission: RE | Admit: 2016-04-21 | Discharge: 2016-04-21 | Disposition: A | Discharge status: Home / Self Care | Payer: Medicare HMO | Source: Ambulatory Visit | Attending: Family Medicine | Admitting: Family Medicine

## 2016-04-21 DIAGNOSIS — I714 Abdominal aortic aneurysm, without rupture: Secondary | ICD-10-CM | POA: Insufficient documentation

## 2016-04-21 DIAGNOSIS — I712 Thoracic aortic aneurysm, without rupture: Secondary | ICD-10-CM | POA: Diagnosis not present

## 2016-04-21 DIAGNOSIS — R0989 Other specified symptoms and signs involving the circulatory and respiratory systems: Secondary | ICD-10-CM | POA: Insufficient documentation

## 2016-04-25 DIAGNOSIS — R69 Illness, unspecified: Secondary | ICD-10-CM | POA: Diagnosis not present

## 2016-04-26 ENCOUNTER — Telehealth: Payer: Self-pay | Admitting: Physician Assistant

## 2016-04-26 DIAGNOSIS — R69 Illness, unspecified: Secondary | ICD-10-CM | POA: Diagnosis not present

## 2016-04-26 NOTE — Telephone Encounter (Signed)
error:[id number] ° °

## 2016-05-02 ENCOUNTER — Encounter: Payer: Medicare HMO | Admitting: Physician Assistant

## 2016-08-09 DIAGNOSIS — R69 Illness, unspecified: Secondary | ICD-10-CM | POA: Diagnosis not present

## 2017-02-19 DIAGNOSIS — R69 Illness, unspecified: Secondary | ICD-10-CM | POA: Diagnosis not present

## 2017-05-04 ENCOUNTER — Encounter: Payer: Self-pay | Admitting: Family Medicine

## 2017-05-04 ENCOUNTER — Ambulatory Visit (INDEPENDENT_AMBULATORY_CARE_PROVIDER_SITE_OTHER): Payer: Medicare HMO | Admitting: Family Medicine

## 2017-05-04 VITALS — BP 130/78 | HR 63 | Temp 97.9°F | Ht 66.0 in | Wt 145.5 lb

## 2017-05-04 DIAGNOSIS — Z23 Encounter for immunization: Secondary | ICD-10-CM | POA: Diagnosis not present

## 2017-05-04 DIAGNOSIS — Z0000 Encounter for general adult medical examination without abnormal findings: Secondary | ICD-10-CM | POA: Diagnosis not present

## 2017-05-04 LAB — COMPREHENSIVE METABOLIC PANEL
ALK PHOS: 79 U/L (ref 39–117)
ALT: 9 U/L (ref 0–53)
AST: 12 U/L (ref 0–37)
Albumin: 3.6 g/dL (ref 3.5–5.2)
BILIRUBIN TOTAL: 0.6 mg/dL (ref 0.2–1.2)
BUN: 20 mg/dL (ref 6–23)
CALCIUM: 9.7 mg/dL (ref 8.4–10.5)
CO2: 30 mEq/L (ref 19–32)
CREATININE: 1.08 mg/dL (ref 0.40–1.50)
Chloride: 102 mEq/L (ref 96–112)
GFR: 69.18 mL/min (ref >60.00)
Glucose, Bld: 86 mg/dL (ref 70–99)
Potassium: 4.2 mEq/L (ref 3.5–5.1)
Sodium: 138 mEq/L (ref 135–145)
TOTAL PROTEIN: 7.7 g/dL (ref 6.0–8.3)

## 2017-05-04 LAB — LIPID PANEL
Cholesterol: 135 mg/dL (ref 0–200)
HDL: 30.5 mg/dL — AB (ref >39.00)
LDL Cholesterol: 86 mg/dL (ref 0–99)
NonHDL: 104.52
Total CHOL/HDL Ratio: 4
Triglycerides: 92 mg/dL (ref 0.0–149.0)
VLDL: 18.4 mg/dL (ref 0.0–40.0)

## 2017-05-04 NOTE — Progress Notes (Signed)
Pre visit review using our clinic review tool, if applicable. No additional management support is needed unless otherwise documented below in the visit note. 

## 2017-05-04 NOTE — Progress Notes (Signed)
Chief Complaint  Patient presents with  . Annual Exam    Well Male Derrick Schultz is here for a complete physical.   His last physical was >1 year ago.  Current diet: in general, a "healthy" diet   Current exercise: walking Weight trend: stable Does pt snore? No. Daytime fatigue? No. Seat belt? Yes.    Health maintenance Tetanus- No PCV13- yes PCV23- no ( was supposed to call insurance co last year, but did not)  Past Medical History:  Diagnosis Date  . Elevated PSA    history of elevated; saw urology  . History of colonic polyps   . History of nephrolithiasis    lithotripsy  . Hypertension   . Skin lesion    of right templs- Dr. Baltazar Najjar    Past Surgical History:  Procedure Laterality Date  . COLONOSCOPY     x 2 intial showed polyps  . HERNIA REPAIR     right inguinal   Medications  Takes no meds routinely.   Allergies No Known Allergies   Family History Family History  Problem Relation Age of Onset  . Suicidality Father        father committed suicide whe pt was 89 y/o  . Alcohol abuse Father   . Angina Mother   . Breast cancer Unknown   . Coronary artery disease Unknown        male 1st degree relative died of MI age 65  . Hypertension Unknown   . Cancer Maternal Aunt     Review of Systems: Constitutional:  no fevers or chills Eye:  no recent significant change in vision Ear/Nose/Mouth/Throat:  Ears:  no tinnitus or hearing loss Nose/Mouth/Throat:  no complaints of nasal congestion or bleeding, no sore throat and oral sores Cardiovascular:  no chest pain, no palpitations Respiratory:  no cough and no shortness of breath Gastrointestinal:  no abdominal pain, no change in bowel habits, no nausea, vomiting, diarrhea, or constipation and no black or bloody stool GU:  Male: negative for dysuria, frequency, and incontinence and negative for prostate symptoms Musculoskeletal/Extremities:  no pain, redness, or swelling of the joints Integumentary (Skin):  +lesion on face (sees derm); otherwise no abnormal skin lesions reported Neurologic:  no headaches, no numbness, tingling Endocrine:  No unexpected weight changes Hematologic/Lymphatic:  no night sweats  Exam BP 130/78 (BP Location: Left Arm, Patient Position: Sitting, Cuff Size: Normal)   Pulse 63   Temp 97.9 F (36.6 C) (Oral)   Ht 5\' 6"  (1.676 m)   Wt 145 lb 8 oz (66 kg)   SpO2 98%   BMI 23.48 kg/m  General:  well developed, well nourished, in no apparent distress Skin:  no significant moles, warts, or growths Head:  no masses, lesions, or tenderness Eyes:  pupils equal and round, sclera anicteric without injection Ears:  canals without lesions, TMs shiny without retraction, no obvious effusion, no erythema Nose:  nares patent, septum midline, mucosa normal Throat/Pharynx:  lips and gingiva without lesion; tongue and uvula midline; non-inflamed pharynx; no exudates or postnasal drainage Neck: neck supple without adenopathy, thyromegaly, or masses Lungs:  clear to auscultation, breath sounds equal bilaterally, no respiratory distress Cardio:  regular rate and rhythm without murmurs, heart sounds without clicks or rubs Abdomen:  abdomen soft, nontender; bowel sounds normal; no masses or organomegaly Genital (male): Uncircumcised penis, no lesions or discharge; testes present bilaterally without masses or tenderness Rectal: Deferred Musculoskeletal:  symmetrical muscle groups noted without atrophy or deformity Extremities:  no clubbing, cyanosis, or edema, no deformities, no skin discoloration Neuro:  gait normal; deep tendon reflexes normal and symmetric Psych: well oriented with normal range of affect and appropriate judgment/insight   Assessment and Plan  Well adult exam - Plan: Comprehensive metabolic panel, Lipid panel  Need for Tdap vaccination - Plan: Tdap vaccine greater than or equal to 7yo IM  Need for vaccination against Streptococcus pneumoniae - Plan: Pneumococcal  polysaccharide vaccine 23-valent greater than or equal to 2yo subcutaneous/IM   Well 84 y.o. male. Counseled on diet and exercise. He's doing well Other orders as above. Follow up in 1 yr or prn.  The patient voiced understanding and agreement to the plan.  [place], DO 05/04/17 9:15 AM

## 2017-05-04 NOTE — Patient Instructions (Addendum)
Call your pharmacy to see about the availability of the new shingles vaccine (Shingrix).  Give [place] 2-3 business days to get the results of your labs back. If labs are normal, you will likely receive a letter in the mail unless you have MyChart. This can take longer than 2-3 business days.   Keep up the good work.

## 2017-05-10 ENCOUNTER — Telehealth: Payer: Self-pay | Admitting: Family Medicine

## 2017-05-10 NOTE — Telephone Encounter (Signed)
Patient informed of results/instructions. Had called him already / left a detailed message/mailed a copy of results---05/04/2017 Did leave a copy at the front desk for him

## 2017-05-10 NOTE — Telephone Encounter (Signed)
Copied from [place]. Topic: Quick Communication - See Telephone Encounter >> May 10, 2017  2:04 PM Bea Graff, NT wrote: CRM for notification. See Telephone encounter for: Pt would like a call with lab results and a print out copy that he can pick up at the office.   05/10/17.

## 2017-08-08 DIAGNOSIS — R69 Illness, unspecified: Secondary | ICD-10-CM | POA: Diagnosis not present

## 2017-09-28 DIAGNOSIS — Z08 Encounter for follow-up examination after completed treatment for malignant neoplasm: Secondary | ICD-10-CM | POA: Diagnosis not present

## 2017-09-28 DIAGNOSIS — L814 Other melanin hyperpigmentation: Secondary | ICD-10-CM | POA: Diagnosis not present

## 2017-09-28 DIAGNOSIS — Z85828 Personal history of other malignant neoplasm of skin: Secondary | ICD-10-CM | POA: Diagnosis not present

## 2017-09-28 DIAGNOSIS — L570 Actinic keratosis: Secondary | ICD-10-CM | POA: Diagnosis not present

## 2017-12-12 ENCOUNTER — Ambulatory Visit (INDEPENDENT_AMBULATORY_CARE_PROVIDER_SITE_OTHER): Payer: Medicare HMO | Admitting: Family Medicine

## 2017-12-12 ENCOUNTER — Encounter: Payer: Self-pay | Admitting: Family Medicine

## 2017-12-12 VITALS — BP 108/72 | HR 75 | Temp 98.3°F | Ht 66.0 in | Wt 134.4 lb

## 2017-12-12 DIAGNOSIS — R634 Abnormal weight loss: Secondary | ICD-10-CM | POA: Diagnosis not present

## 2017-12-12 DIAGNOSIS — R2681 Unsteadiness on feet: Secondary | ICD-10-CM

## 2017-12-12 DIAGNOSIS — S46812A Strain of other muscles, fascia and tendons at shoulder and upper arm level, left arm, initial encounter: Secondary | ICD-10-CM

## 2017-12-12 NOTE — Patient Instructions (Addendum)
Stay hydrated and keep the diet clean.   Stay active.   Give [place] 2-3 business days to get the results of your labs back.   Heat (pad or rice pillow in microwave) over affected area, 10-15 minutes twice daily.   Let us know if you need anything.  Trapezius stretches/exercises  It is normal to feel mild stretching, pulling, tightness, or discomfort as you do these exercises, but you should stop right away if you feel sudden pain or your pain gets worse.  Stretching and range of motion exercises These exercises warm up your muscles and joints and improve the movement and flexibility of your shoulder. These exercises can also help to relieve pain, numbness, and tingling. If you are unable to do any of the following for any reason, do not further attempt to do it.   Exercise A: Flexion, standing    1. Stand and hold a broomstick, a cane, or a similar object. Place your hands a little more than shoulder-width apart on the object. Your left / right hand should be palm-up, and your other hand should be palm-down. 2. Push the stick to raise your left / right arm out to your side and then over your head. Use your other hand to help move the stick. Stop when you feel a stretch in your shoulder, or when you reach the angle that is recommended by your health care provider. ? Avoid shrugging your shoulder while you raise your arm. Keep your shoulder blade tucked down toward your spine. 3. Hold for 30 seconds. 4. Slowly return to the starting position. Repeat 2 times. Complete this exercise 3 times per week.  Exercise B: Abduction, supine    1. Lie on your back and hold a broomstick, a cane, or a similar object. Place your hands a little more than shoulder-width apart on the object. Your left / right hand should be palm-up, and your other hand should be palm-down. 2. Push the stick to raise your left / right arm out to your side and then over your head. Use your other hand to help move the stick.  Stop when you feel a stretch in your shoulder, or when you reach the angle that is recommended by your health care provider. ? Avoid shrugging your shoulder while you raise your arm. Keep your shoulder blade tucked down toward your spine. 3. Hold for 30 seconds. 4. Slowly return to the starting position. Repeat 2 times. Complete this exercise 3 times per week.  Exercise C: Flexion, active-assisted    1. Lie on your back. You may bend your knees for comfort. 2. Hold a broomstick, a cane, or a similar object. Place your hands about shoulder-width apart on the object. Your palms should face toward your feet. 3. Raise the stick and move your arms over your head and behind your head, toward the floor. Use your healthy arm to help your left / right arm move farther. Stop when you feel a gentle stretch in your shoulder, or when you reach the angle where your health care provider tells you to stop. 4. Hold for 30 seconds. 5. Slowly return to the starting position. Repeat 2 times. Complete this exercise 3 times per week.  Exercise D: External rotation and abduction    1. Stand in a door frame with one of your feet slightly in front of the other. This is called a staggered stance. 2. Choose one of the following positions as told by your health care provider: ? Place  your hands and forearms on the door frame above your head. ? Place your hands and forearms on the door frame at the height of your head. ? Place your hands on the door frame at the height of your elbows. 3. Slowly move your weight onto your front foot until you feel a stretch across your chest and in the front of your shoulders. Keep your head and chest upright and keep your abdominal muscles tight. 4. Hold for 30 seconds. 5. To release the stretch, shift your weight to your back foot. Repeat 2 times. Complete this stretch 3 times per week.  Strengthening exercises These exercises build strength and endurance in your shoulder.  Endurance is the ability to use your muscles for a long time, even after your muscles get tired. Exercise E: Scapular depression and adduction  1. Sit on a stable chair. Support your arms in front of you with pillows, armrests, or a tabletop. Keep your elbows in line with the sides of your body. 2. Gently move your shoulder blades down toward your middle back. Relax the muscles on the tops of your shoulders and in the back of your neck. 3. Hold for 3 seconds. 4. Slowly release the tension and relax your muscles completely before doing this exercise again. Repeat for a total of 10 repetitions. 5. After you have practiced this exercise, try doing the exercise without the arm support. Then, try the exercise while standing instead of sitting. Repeat 2 times. Complete this exercise 3 times per week.  Exercise F: Shoulder abduction, isometric    1. Stand or sit about 4-6 inches (10-15 cm) from a wall with your left / right side facing the wall. 2. Bend your left / right elbow and gently press your elbow against the wall. 3. Increase the pressure slowly until you are pressing as hard as you can without shrugging your shoulder. 4. Hold for 3 seconds. 5. Slowly release the tension and relax your muscles completely. Repeat for a total of 10 repetitions. Repeat 2 times. Complete this exercise 3 times per week.  Exercise G: Shoulder flexion, isometric    1. Stand or sit about 4-6 inches (10-15 cm) away from a wall with your left / right side facing the wall. 2. Keep your left / right elbow straight and gently press the top of your fist against the wall. Increase the pressure slowly until you are pressing as hard as you can without shrugging your shoulder. 3. Hold for 10-15 seconds. 4. Slowly release the tension and relax your muscles completely. Repeat for a total of 10 repetitions. Repeat 2 times. Complete this exercise 3 times per week.  Exercise H: Internal rotation    1. Sit in a stable  chair without armrests, or stand. Secure an exercise band at your left / right side, at elbow height. 2. Place a soft object, such as a folded towel or a small pillow, under your left / right upper arm so your elbow is a few inches (about 8 cm) away from your side. 3. Hold the end of the exercise band so the band stretches. 4. Keeping your elbow pressed against the soft object under your arm, move your forearm across your body toward your abdomen. Keep your body steady so the movement is only coming from your shoulder. 5. Hold for 3 seconds. 6. Slowly return to the starting position. Repeat for a total of 10 repetitions. Repeat 2 times. Complete this exercise 3 times per week.  Exercise I: External  rotation    1. Sit in a stable chair without armrests, or stand. 2. Secure an exercise band at your left / right side, at elbow height. 3. Place a soft object, such as a folded towel or a small pillow, under your left / right upper arm so your elbow is a few inches (about 8 cm) away from your side. 4. Hold the end of the exercise band so the band stretches. 5. Keeping your elbow pressed against the soft object under your arm, move your forearm out, away from your abdomen. Keep your body steady so the movement is only coming from your shoulder. 6. Hold for 3 seconds. 7. Slowly return to the starting position. Repeat for a total of 10 repetitions. Repeat 2 times. Complete this exercise 3 times per week. Exercise J: Shoulder extension  1. Sit in a stable chair without armrests, or stand. Secure an exercise band to a stable object in front of you so the band is at shoulder height. 2. Hold one end of the exercise band in each hand. Your palms should face each other. 3. Straighten your elbows and lift your hands up to shoulder height. 4. Step back, away from the secured end of the exercise band, until the band stretches. 5. Squeeze your shoulder blades together and pull your hands down to the sides of  your thighs. Stop when your hands are straight down by your sides. Do not let your hands go behind your body. 6. Hold for 3 seconds. 7. Slowly return to the starting position. Repeat for a total of 10 repetitions. Repeat 2 times. Complete this exercise 3 times per week.  Exercise K: Shoulder extension, prone    1. Lie on your abdomen on a firm surface so your left / right arm hangs over the edge. 2. Hold a 5 lb weight in your hand so your palm faces in toward your body. Your arm should be straight. 3. Squeeze your shoulder blade down toward the middle of your back. 4. Slowly raise your arm behind you, up to the height of the surface that you are lying on. Keep your arm straight. 5. Hold for 3 seconds. 6. Slowly return to the starting position and relax your muscles. Repeat for a total of 10 repetitions. Repeat 2 times. Complete this exercise 3 times per week.   Exercise L: Horizontal abduction, prone  1. Lie on your abdomen on a firm surface so your left / right arm hangs over the edge. 2. Hold a 5 lb weight in your hand so your palm faces toward your feet. Your arm should be straight. 3. Squeeze your shoulder blade down toward the middle of your back. 4. Bend your elbow so your hand moves up, until your elbow is bent to an "L" shape (90 degrees). With your elbow bent, slowly move your forearm forward and up. Raise your hand up to the height of the surface that you are lying on. ? Your upper arm should not move, and your elbow should stay bent. ? At the top of the movement, your palm should face the floor. 5. Hold for 3 seconds. 6. Slowly return to the starting position and relax your muscles. Repeat for a total of 10 repetitions. Repeat 2 times. Complete this exercise 3 times per week.  Exercise M: Horizontal abduction, standing  1. Sit on a stable chair, or stand. 2. Secure an exercise band to a stable object in front of you so the band is at shoulder height.  3. Hold one end of the  exercise band in each hand. 4. Straighten your elbows and lift your hands straight in front of you, up to shoulder height. Your palms should face down, toward the floor. 5. Step back, away from the secured end of the exercise band, until the band stretches. 6. Move your arms out to your sides, and keep your arms straight. 7. Hold for 3 seconds. 8. Slowly return to the starting position. Repeat for a total of 10 repetitions. Repeat 2 times. Complete this exercise 3 times per week.  Exercise N: Scapular retraction and elevation  1. Sit on a stable chair, or stand. 2. Secure an exercise band to a stable object in front of you so the band is at shoulder height. 3. Hold one end of the exercise band in each hand. Your palms should face each other. 4. Sit in a stable chair without armrests, or stand. 5. Step back, away from the secured end of the exercise band, until the band stretches. 6. Squeeze your shoulder blades together and lift your hands over your head. Keep your elbows straight. 7. Hold for 3 seconds. 8. Slowly return to the starting position. Repeat for a total of 10 repetitions. Repeat 2 times. Complete this exercise 3 times per week.  This information is not intended to replace advice given to you by your health care provider. Make sure you discuss any questions you have with your health care provider. Document Released: 02/20/2005 Document Revised: 10/28/2015 Document Reviewed: 01/07/2015 Elsevier Interactive Patient Education  2017 [employer].

## 2017-12-12 NOTE — Progress Notes (Signed)
Chief Complaint  Patient presents with  . Follow-up  . Weight Loss    Subjective: Patient is a 84 y.o. male here for wt loss.  Unintentional wt loss. Does not eat as much as he used to as food not as tasty. Walks 2 mi every other day, but this has not changed recently. Denies pain outside of L shoulder, cough, fevers, sob, urinary complaints outside of frequency, diarrhea, bleeding, night sweats. Energy is normal and no fatigue.   Having L neck/shoulder pain. This is chronic and he believes it is radiating to the rest of his body. No recent inj or change in activity. Has not tried any medication at home.   ROS: Endo: +wt loss Const: no fevers  Past Medical History:  Diagnosis Date  . Elevated PSA    history of elevated; saw urology  . History of colonic polyps   . History of nephrolithiasis    lithotripsy  . Hypertension   . Skin lesion    of right templs- Dr. Baltazar Najjar    Objective: BP 108/72 (BP Location: Left Arm, Cuff Size: Normal)   Pulse 75   Temp 98.3 F (36.8 C) (Oral)   Ht 5' 6"  (1.676 m)   Wt 134 lb 6 oz (61 kg)   SpO2 97%   BMI 21.69 kg/m  General: Awake, appears stated age HEENT: MMM, EOMi Heart: RRR, no LE edema Abd: BS+, soft, NT, ND  MSK: +empty can on L, neg Neer's, Hawkins, Speed's, lift off, cross over; +TTP over L trap  Neuro: DTR's equal and symmetric Lungs: CTAB, no rales, wheezes or rhonchi. No accessory muscle use Psych: Age appropriate judgment and insight, normal affect and mood  Assessment and Plan: Unintentional weight loss - Plan: CBC w/Diff, TSH, Comprehensive metabolic panel  Strain of left trapezius muscle, initial encounter  Unsteady - Plan: EKG 12-Lead  Orders as above. Ck labs, could be age related wt loss and decreased PO intake. R/o met cause. Will consider CCS despite age if labs show anemia or nml, could discuss referral to GI. No B symptoms.  Stretches/exercises for trap given. Could consider trigger pt injections. Heat,  Tylenol also rec'd.  EKG and orthostatics nml.  F/u in 1 mo to recheck wt and shoulder.  The patient voiced understanding and agreement to the plan.  [place], DO 12/12/17  4:27 PM

## 2017-12-12 NOTE — Progress Notes (Signed)
Pre visit review using our clinic review tool, if applicable. No additional management support is needed unless otherwise documented below in the visit note. 

## 2017-12-13 ENCOUNTER — Emergency Department (HOSPITAL_BASED_OUTPATIENT_CLINIC_OR_DEPARTMENT_OTHER)
Admission: EM | Admit: 2017-12-13 | Discharge: 2017-12-13 | Disposition: A | Discharge status: Home / Self Care | Payer: Medicare HMO | Attending: Emergency Medicine | Admitting: Emergency Medicine

## 2017-12-13 ENCOUNTER — Telehealth: Payer: Self-pay | Admitting: *Deleted

## 2017-12-13 ENCOUNTER — Encounter (HOSPITAL_BASED_OUTPATIENT_CLINIC_OR_DEPARTMENT_OTHER): Payer: Self-pay | Admitting: Emergency Medicine

## 2017-12-13 ENCOUNTER — Other Ambulatory Visit (INDEPENDENT_AMBULATORY_CARE_PROVIDER_SITE_OTHER): Payer: Medicare HMO

## 2017-12-13 ENCOUNTER — Other Ambulatory Visit: Payer: Self-pay

## 2017-12-13 DIAGNOSIS — R898 Other abnormal findings in specimens from other organs, systems and tissues: Secondary | ICD-10-CM | POA: Diagnosis not present

## 2017-12-13 DIAGNOSIS — R634 Abnormal weight loss: Secondary | ICD-10-CM | POA: Diagnosis not present

## 2017-12-13 DIAGNOSIS — R899 Unspecified abnormal finding in specimens from other organs, systems and tissues: Secondary | ICD-10-CM | POA: Insufficient documentation

## 2017-12-13 DIAGNOSIS — I10 Essential (primary) hypertension: Secondary | ICD-10-CM | POA: Insufficient documentation

## 2017-12-13 LAB — CBC WITH DIFFERENTIAL/PLATELET
Abs Immature Granulocytes: 0.03 10*3/uL (ref 0.00–0.07)
BASOS PCT: 1 %
Basophils Absolute: 0 10*3/uL (ref 0.0–0.1)
Basophils Absolute: 0 10*3/uL (ref 0.0–0.1)
Basophils Relative: 0.6 % (ref 0.0–3.0)
EOS ABS: 0.1 10*3/uL (ref 0.0–0.5)
EOS PCT: 1.7 % (ref 0.0–5.0)
EOS PCT: 2 %
Eosinophils Absolute: 0.1 10*3/uL (ref 0.0–0.7)
HCT: 47 % (ref 39.0–52.0)
HEMATOCRIT: 46.8 % (ref 39.0–52.0)
HEMOGLOBIN: 15.2 g/dL (ref 13.0–17.0)
Hemoglobin: 14.4 g/dL (ref 13.0–17.0)
Immature Granulocytes: 0 %
LYMPHS PCT: 22.2 % (ref 12.0–46.0)
Lymphocytes Relative: 19 %
Lymphs Abs: 1.7 10*3/uL (ref 0.7–4.0)
Lymphs Abs: 1.6 10*3/uL (ref 0.7–4.0)
MCH: 26.6 pg (ref 26.0–34.0)
MCHC: 32.5 g/dL (ref 30.0–36.0)
MCHC: 30.6 g/dL (ref 30.0–36.0)
MCV: 83.5 fl (ref 78.0–100.0)
MCV: 86.7 fL (ref 80.0–100.0)
MONO ABS: 1 10*3/uL (ref 0.1–1.0)
MONOS PCT: 12 %
Monocytes Absolute: 1 10*3/uL (ref 0.1–1.0)
Monocytes Relative: 13.1 % — ABNORMAL HIGH (ref 3.0–12.0)
Neutro Abs: 4.9 10*3/uL (ref 1.4–7.7)
Neutro Abs: 5.5 10*3/uL (ref 1.7–7.7)
Neutrophils Relative %: 62.4 % (ref 43.0–77.0)
Neutrophils Relative %: 66 %
PLATELETS: 286 10*3/uL (ref 150–400)
Platelets: 280 10*3/uL (ref 150.0–400.0)
RBC: 5.6 Mil/uL (ref 4.22–5.81)
RBC: 5.42 MIL/uL (ref 4.22–5.81)
RDW: 16.1 % — ABNORMAL HIGH (ref 11.5–15.5)
RDW: 15 % (ref 11.5–15.5)
WBC: 7.8 10*3/uL (ref 4.0–10.5)
WBC: 8.3 10*3/uL (ref 4.0–10.5)
nRBC: 0 % (ref 0.0–0.2)

## 2017-12-13 LAB — BASIC METABOLIC PANEL
Anion gap: 6 (ref 5–15)
BUN: 20 mg/dL (ref 8–23)
CALCIUM: 9.3 mg/dL (ref 8.9–10.3)
CO2: 28 mmol/L (ref 22–32)
Chloride: 103 mmol/L (ref 98–111)
Creatinine, Ser: 0.99 mg/dL (ref 0.61–1.24)
GFR calc Af Amer: >60 mL/min (ref >60)
GFR calc non Af Amer: >60 mL/min (ref >60)
GLUCOSE: 120 mg/dL — AB (ref 70–99)
POTASSIUM: 4.3 mmol/L (ref 3.5–5.1)
SODIUM: 137 mmol/L (ref 135–145)

## 2017-12-13 LAB — COMPREHENSIVE METABOLIC PANEL
ALBUMIN: 3.5 g/dL (ref 3.5–5.2)
ALK PHOS: 65 U/L (ref 39–117)
ALT: 8 U/L (ref 0–53)
AST: 10 U/L (ref 0–37)
BUN: 20 mg/dL (ref 6–23)
CALCIUM: 9.9 mg/dL (ref 8.4–10.5)
CO2: 30 mEq/L (ref 19–32)
Chloride: 103 mEq/L (ref 96–112)
Creatinine, Ser: 1.08 mg/dL (ref 0.40–1.50)
GFR: 69.08 mL/min (ref >60.00)
Glucose, Bld: 81 mg/dL (ref 70–99)
POTASSIUM: NONE SEEN meq/L — AB (ref 3.5–5.1)
Sodium: 138 mEq/L (ref 135–145)
TOTAL PROTEIN: 7.2 g/dL (ref 6.0–8.3)
Total Bilirubin: 0.4 mg/dL (ref 0.2–1.2)

## 2017-12-13 LAB — TSH: TSH: 7.9 u[IU]/mL — ABNORMAL HIGH (ref 0.35–4.50)

## 2017-12-13 NOTE — ED Notes (Signed)
Pt on monitor 

## 2017-12-13 NOTE — Telephone Encounter (Signed)
Please have pt go to ER.

## 2017-12-13 NOTE — Telephone Encounter (Signed)
[facility] called with critical lab results on patient.  Potassium elevated at 6.1.

## 2017-12-13 NOTE — Telephone Encounter (Signed)
Notified pt. Pt is agreed to go to ER

## 2017-12-13 NOTE — ED Triage Notes (Signed)
Was told to come to ED by PCP for ? High K+, denies other c/o's

## 2017-12-13 NOTE — ED Provider Notes (Signed)
[facility] EMERGENCY DEPARTMENT Provider Note   CSN: [id number] Arrival date & time: 12/13/17  1741     History   Chief Complaint Chief Complaint  Patient presents with  . Hyperkalemia    HPI Derrick Schultz is a 84 y.o. male.  HPI    84 year old male presents with concern for abnormal labs found on outpatient testing.  Patient reports that he has been labs done as a routine check.  He was told to come to the emergency department due to concern for hyperkalemia.  He reports no symptoms, denies chest pain, shortness of breath, dizziness, palpitations, vomiting, diarrhea.  Past Medical History:  Diagnosis Date  . Elevated PSA    history of elevated; saw urology  . History of colonic polyps   . History of nephrolithiasis    lithotripsy  . Hypertension   . Skin lesion    of right templs- Dr. Baltazar Najjar    Patient Active Problem List   Diagnosis Date Noted  . Strain of left trapezius muscle 12/12/2017  . Screening, ischemic heart disease 04/08/2014  . Hyperlipidemia 03/26/2013  . Non-healing skin lesion 12/05/2010  . Annual physical exam 09/10/2010  . INTERMITTENT VERTIGO 03/09/2010  . ACTINIC KERATOSIS 10/25/2009  . DEGENERATIVE JOINT DISEASE, HANDS 10/25/2009  . PROSTATE SPECIFIC ANTIGEN, ELEVATED 04/30/2009  . Essential hypertension 04/28/2009  . BPH (benign prostatic hyperplasia) 04/28/2009  . COLONIC POLYPS, HX OF 04/28/2009  . NEPHROLITHIASIS, HX OF 04/28/2009    Past Surgical History:  Procedure Laterality Date  . COLONOSCOPY     x 2 intial showed polyps  . HERNIA REPAIR     right inguinal        Home Medications    Prior to Admission medications   Not on File    Family History Family History  Problem Relation Age of Onset  . Suicidality Father        father committed suicide whe pt was 64 y/o  . Alcohol abuse Father   . Angina Mother   . Breast cancer Unknown   . Coronary artery disease Unknown        male 1st degree relative  died of MI age 44  . Hypertension Unknown   . Cancer Maternal Aunt     Social History Social History   Tobacco Use  . Smoking status: Never Smoker  . Smokeless tobacco: Never Used  Substance Use Topics  . Alcohol use: No  . Drug use: No     Allergies   Patient has no known allergies.   Review of Systems Review of Systems  Constitutional: Negative for fever.  Eyes: Negative for visual disturbance.  Respiratory: Negative for shortness of breath.   Cardiovascular: Negative for chest pain and palpitations.  Gastrointestinal: Negative for abdominal pain, diarrhea and vomiting.  Genitourinary: Negative for difficulty urinating and dysuria.  Neurological: Negative for dizziness, syncope and headaches.     Physical Exam Updated Vital Signs BP 138/66   Pulse 63   Temp 97.9 F (36.6 C) (Oral)   Resp 16   Ht 5\' 6"  (1.676 m)   Wt 60 kg   SpO2 96%   BMI 21.35 kg/m   Physical Exam  Constitutional: He is oriented to person, place, and time. He appears well-developed and well-nourished. No distress.  HENT:  Head: Normocephalic and atraumatic.  Eyes: Conjunctivae and EOM are normal.  Neck: Normal range of motion.  Cardiovascular: Normal rate, regular rhythm, normal heart sounds and intact distal pulses. Exam reveals  no gallop and no friction rub.  No murmur heard. Pulmonary/Chest: Effort normal and breath sounds normal. No respiratory distress. He has no wheezes. He has no rales.  Musculoskeletal: He exhibits no edema.  Neurological: He is alert and oriented to person, place, and time.  Skin: Skin is warm and dry. He is not diaphoretic.  Nursing note and vitals reviewed.    ED Treatments / Results  Labs (all labs ordered are listed, but only abnormal results are displayed) Labs Reviewed  BASIC METABOLIC PANEL - Abnormal; Notable for the following components:      Result Value   Glucose, Bld 120 (*)    All other components within normal limits  CBC WITH  DIFFERENTIAL/PLATELET    EKG EKG Interpretation  Date/Time:  Thursday December 13 2017 18:26:00 EDT Ventricular Rate:  67 PR Interval:    QRS Duration: 100 QT Interval:  396 QTC Calculation: 418 R Axis:   37 Text Interpretation:  Sinus rhythm No significant change since last tracing Confirmed by Gareth Morgan [phone]) on 12/13/2017 6:44:37 PM Also confirmed by Gareth Morgan [phone]), editor Philomena Doheny [phone])  on 12/14/2017 7:04:16 AM   Radiology No results found.  Procedures Procedures (including critical care time)  Medications Ordered in ED Medications - No data to display   Initial Impression / Assessment and Plan / ED Course  I have reviewed the triage vital signs and the nursing notes.  Pertinent labs & imaging results that were available during my care of the patient were reviewed by me and considered in my medical decision making (see chart for details).     84 year old male presents with concern for abnormal labs found on outpatient testing.  Outpatient labs showed a potassium of 6.1.  EKG and recheck of labs were drawn.  EKG showed no findings to suggest significant hyperkalemia.  Repeat lab testing in the emergency department here shows a potassium of 4.3, no other significant abnormalities.  Patient is asymptomatic.  Recommend regular follow-up with primary care physician.  Final Clinical Impressions(s) / ED Diagnoses   Final diagnoses:  Abnormal laboratory test    ED Discharge Orders    None       Gareth Morgan, MD 12/14/17 1124

## 2017-12-14 ENCOUNTER — Other Ambulatory Visit (INDEPENDENT_AMBULATORY_CARE_PROVIDER_SITE_OTHER): Payer: Medicare HMO

## 2017-12-14 DIAGNOSIS — R946 Abnormal results of thyroid function studies: Secondary | ICD-10-CM | POA: Diagnosis not present

## 2017-12-14 LAB — T4, FREE: FREE T4: 0.75 ng/dL (ref 0.60–1.60)

## 2017-12-17 ENCOUNTER — Encounter: Payer: Self-pay | Admitting: Family Medicine

## 2018-01-09 ENCOUNTER — Ambulatory Visit: Payer: Medicare HMO | Admitting: Family Medicine

## 2018-02-12 DIAGNOSIS — R69 Illness, unspecified: Secondary | ICD-10-CM | POA: Diagnosis not present

## 2018-03-19 ENCOUNTER — Encounter: Payer: Self-pay | Admitting: Medical

## 2018-03-19 ENCOUNTER — Ambulatory Visit (INDEPENDENT_AMBULATORY_CARE_PROVIDER_SITE_OTHER): Payer: Medicare HMO | Admitting: Medical

## 2018-03-19 VITALS — BP 136/68 | HR 66 | Temp 98.1°F | Resp 16 | Ht 66.0 in | Wt 137.2 lb

## 2018-03-19 DIAGNOSIS — R0981 Nasal congestion: Secondary | ICD-10-CM | POA: Diagnosis not present

## 2018-03-19 DIAGNOSIS — J069 Acute upper respiratory infection, unspecified: Secondary | ICD-10-CM

## 2018-03-19 MED ORDER — FLUTICASONE PROPIONATE 50 MCG/ACT NA SUSP: 2.0000 | Freq: Every day | NASAL | 1 refills | Status: DC

## 2018-03-19 MED ORDER — BENZONATATE 100 MG PO CAPS: 100.0000 mg | ORAL_CAPSULE | Freq: Three times a day (TID) | ORAL | 0 refills | Status: DC | PRN

## 2018-03-19 MED ORDER — AZITHROMYCIN 250 MG PO TABS: ORAL_TABLET | ORAL | 0 refills | Status: DC

## 2018-03-19 NOTE — Progress Notes (Signed)
Subjective:    Patient ID: Derrick Schultz, male    DOB: [birth year], 85 y.o.   MRN: [id number]  HPI  Pt in with some nasal congestion and runny nose for about 3 weeks. When he blows nose will get yellow mucus. Pt denies any fever, no chills or sweats. Pt has arthritis all over. When states cold and rainy bones will ache(weather like this today).No diffuse myalgias.   No wheezing. Pt states using hall lozenges and feeling gradually better.   Pt states recently feeling better/improving.   Review of Systems  Constitutional: Negative for chills, diaphoresis and fatigue.  HENT: Positive for congestion, postnasal drip and rhinorrhea. Negative for ear pain, sinus pressure and sinus pain.   Respiratory: Negative for cough, chest tightness, shortness of breath and wheezing.   Cardiovascular: Negative for chest pain and palpitations.  Gastrointestinal: Negative for abdominal pain.  Musculoskeletal: Negative for back pain, gait problem and joint swelling.  Skin: Negative for rash.  Neurological: Negative for dizziness, syncope, weakness and headaches.  Hematological: Negative for adenopathy. Does not bruise/bleed easily.  Psychiatric/Behavioral: Negative for behavioral problems, confusion and sleep disturbance. The patient is not nervous/anxious.     Past Medical History:  Diagnosis Date  . Elevated PSA    history of elevated; saw urology  . History of colonic polyps   . History of nephrolithiasis    lithotripsy  . Hypertension   . Skin lesion    of right templs- Dr. Baltazar Najjar     Social History   Socioeconomic History  . Marital status: Widowed    Spouse name: Not on file  . Number of children: 3  . Years of education: Not on file  . Highest education level: Not on file  Occupational History    Employer: RETIRED  Social Needs  . Financial resource strain: Not on file  . Food insecurity:    Worry: Not on file    Inability: Not on file  . Transportation needs:    Medical:  Not on file    Non-medical: Not on file  Tobacco Use  . Smoking status: Never Smoker  . Smokeless tobacco: Never Used  Substance and Sexual Activity  . Alcohol use: No  . Drug use: No  . Sexual activity: Not on file  Lifestyle  . Physical activity:    Days per week: Not on file    Minutes per session: Not on file  . Stress: Not on file  Relationships  . Social connections:    Talks on phone: Not on file    Gets together: Not on file    Attends religious service: Not on file    Active member of club or organization: Not on file    Attends meetings of clubs or organizations: Not on file    Relationship status: Not on file  . Intimate partner violence:    Fear of current or ex partner: Not on file    Emotionally abused: Not on file    Physically abused: Not on file    Forced sexual activity: Not on file  Other Topics Concern  . Not on file  Social History Narrative   Retired - worked as Games developer   Widowed since 2005   2 daughters, 1 son  (son lives nearby)   Never Smoked    Alcohol use-no     Past Surgical History:  Procedure Laterality Date  . COLONOSCOPY     x 2 intial showed polyps  . HERNIA REPAIR  right inguinal    Family History  Problem Relation Age of Onset  . Suicidality Father        father committed suicide whe pt was 48 y/o  . Alcohol abuse Father   . Angina Mother   . Breast cancer Unknown   . Coronary artery disease Unknown        male 1st degree relative died of MI age 89  . Hypertension Unknown   . Cancer Maternal Aunt     No Known Allergies  No current outpatient medications on file prior to visit.   No current facility-administered medications on file prior to visit.     BP 136/68   Pulse 66   Temp 98.1 F (36.7 C) (Oral)   Resp 16   Ht 5\' 6"  (1.676 m)   Wt 137 lb 3.2 oz (62.2 kg)   SpO2 100%   BMI 22.14 kg/m       Objective:   Physical Exam   General  Mental Status - Alert. General Appearance - Well groomed. Not  in acute distress.  Skin Rashes- No Rashes.  HEENT Head- Normal. Ear Auditory Canal - Left- Normal. Right - Normal.Tympanic Membrane- Left- Normal. Right- Normal. Eye Sclera/Conjunctiva- Left- Normal. Right- Normal. Nose & Sinuses Nasal Mucosa- Left-  Boggy and Congested. Right-  Boggy and  Congested.Bilateral  No maxillary and no  frontal sinus pressure. Mouth & Throat Lips: Upper Lip- Normal: no dryness, cracking, pallor, cyanosis, or vesicular eruption. Lower Lip-Normal: no dryness, cracking, pallor, cyanosis or vesicular eruption. Buccal Mucosa- Bilateral- No Aphthous ulcers. Oropharynx- No Discharge or Erythema. +pnd. Tonsils: Characteristics- Bilateral- No Erythema or Congestion. Size/Enlargement- Bilateral- No enlargement. Discharge- bilateral-None.  Neck Neck- Supple. No Masses.   Chest and Lung Exam Auscultation: Breath Sounds:-Clear even and unlabored.  Cardiovascular Auscultation:Rythm- Regular, rate and rhythm. Murmurs & Other Heart Sounds:Ausculatation of the heart reveal- No Murmurs.  Lymphatic Head & Neck General Head & Neck Lymphatics: Bilateral: Description- No Localized lymphadenopathy.     Assessment & Plan:  You do have mostly upper respiratory infection/viral type symptoms.  Illness has been for about 3 weeks.  You do report feeling some better this week.  I am going to prescribe you Flonase for nasal congestion and benzonatate for cough.  Rx advisement given for benzonatate.  Can continue your Hall's lozenges.  Some concern with the duration of the illness.  Concerned that eventually you may get a secondary bacterial infection.  Your lungs do sound clear presently.  If by Friday your symptoms still persisting or worsening as described then would recommend that you start a azithromycin antibiotic.  Providing this as a print prescription.  Follow-up in 7 to 10 days or as needed.  Mackie Pai, PA-C

## 2018-03-19 NOTE — Patient Instructions (Signed)
You do have mostly upper respiratory infection/viral type symptoms.  Illness has been for about 3 weeks.  You do report feeling some better this week.  I am going to prescribe you Flonase for nasal congestion and benzonatate for cough.  Rx advisement given for benzonatate.  Can continue your Hall's lozenges.  Some concern with the duration of the illness.  Concerned that eventually you may get a secondary bacterial infection.  Your lungs do sound clear presently.  If by Friday your symptoms still persisting or worsening as described then would recommend that you start a azithromycin antibiotic.  Providing this as a print prescription.  Follow-up in 7 to 10 days or as needed.

## 2018-05-08 ENCOUNTER — Encounter: Payer: Medicare HMO | Admitting: Family Medicine

## 2018-05-23 ENCOUNTER — Emergency Department (HOSPITAL_COMMUNITY): Payer: Medicare HMO | Admitting: Certified Registered Nurse Anesthetist

## 2018-05-23 ENCOUNTER — Encounter (HOSPITAL_BASED_OUTPATIENT_CLINIC_OR_DEPARTMENT_OTHER): Payer: Self-pay | Admitting: Emergency Medicine

## 2018-05-23 ENCOUNTER — Encounter (HOSPITAL_COMMUNITY)
Admission: EM | Disposition: A | Discharge status: Home / Self Care | Payer: Self-pay | Source: Home / Self Care | Attending: Emergency Medicine

## 2018-05-23 ENCOUNTER — Emergency Department (HOSPITAL_COMMUNITY): Payer: Medicare HMO

## 2018-05-23 ENCOUNTER — Observation Stay (HOSPITAL_BASED_OUTPATIENT_CLINIC_OR_DEPARTMENT_OTHER)
Admission: EM | Admit: 2018-05-23 | Discharge: 2018-05-24 | Disposition: A | Discharge status: Home / Self Care | Payer: Medicare HMO | Attending: Urology | Admitting: Urology

## 2018-05-23 ENCOUNTER — Other Ambulatory Visit: Payer: Self-pay

## 2018-05-23 ENCOUNTER — Emergency Department (HOSPITAL_BASED_OUTPATIENT_CLINIC_OR_DEPARTMENT_OTHER): Payer: Medicare HMO

## 2018-05-23 DIAGNOSIS — N200 Calculus of kidney: Secondary | ICD-10-CM | POA: Diagnosis present

## 2018-05-23 DIAGNOSIS — Z803 Family history of malignant neoplasm of breast: Secondary | ICD-10-CM | POA: Diagnosis not present

## 2018-05-23 DIAGNOSIS — N2889 Other specified disorders of kidney and ureter: Secondary | ICD-10-CM

## 2018-05-23 DIAGNOSIS — Z8249 Family history of ischemic heart disease and other diseases of the circulatory system: Secondary | ICD-10-CM | POA: Insufficient documentation

## 2018-05-23 DIAGNOSIS — E785 Hyperlipidemia, unspecified: Secondary | ICD-10-CM | POA: Diagnosis not present

## 2018-05-23 DIAGNOSIS — R69 Illness, unspecified: Secondary | ICD-10-CM | POA: Diagnosis not present

## 2018-05-23 DIAGNOSIS — N135 Crossing vessel and stricture of ureter without hydronephrosis: Secondary | ICD-10-CM

## 2018-05-23 DIAGNOSIS — Z87442 Personal history of urinary calculi: Secondary | ICD-10-CM | POA: Insufficient documentation

## 2018-05-23 DIAGNOSIS — N400 Enlarged prostate without lower urinary tract symptoms: Secondary | ICD-10-CM | POA: Diagnosis not present

## 2018-05-23 DIAGNOSIS — N201 Calculus of ureter: Secondary | ICD-10-CM | POA: Diagnosis not present

## 2018-05-23 DIAGNOSIS — Z811 Family history of alcohol abuse and dependence: Secondary | ICD-10-CM | POA: Insufficient documentation

## 2018-05-23 DIAGNOSIS — R1032 Left lower quadrant pain: Secondary | ICD-10-CM | POA: Diagnosis not present

## 2018-05-23 DIAGNOSIS — R42 Dizziness and giddiness: Secondary | ICD-10-CM | POA: Diagnosis not present

## 2018-05-23 DIAGNOSIS — Z86010 Personal history of colonic polyps: Secondary | ICD-10-CM | POA: Insufficient documentation

## 2018-05-23 DIAGNOSIS — K769 Liver disease, unspecified: Secondary | ICD-10-CM | POA: Diagnosis not present

## 2018-05-23 DIAGNOSIS — Z809 Family history of malignant neoplasm, unspecified: Secondary | ICD-10-CM | POA: Insufficient documentation

## 2018-05-23 DIAGNOSIS — N132 Hydronephrosis with renal and ureteral calculous obstruction: Principal | ICD-10-CM | POA: Insufficient documentation

## 2018-05-23 DIAGNOSIS — R110 Nausea: Secondary | ICD-10-CM | POA: Diagnosis not present

## 2018-05-23 DIAGNOSIS — M1990 Unspecified osteoarthritis, unspecified site: Secondary | ICD-10-CM | POA: Insufficient documentation

## 2018-05-23 DIAGNOSIS — I10 Essential (primary) hypertension: Secondary | ICD-10-CM | POA: Diagnosis not present

## 2018-05-23 HISTORY — PX: CYSTOSCOPY WITH RETROGRADE PYELOGRAM, URETEROSCOPY AND STENT PLACEMENT: SHX5789

## 2018-05-23 HISTORY — DX: Personal history of urinary calculi: Z87.442

## 2018-05-23 LAB — COMPREHENSIVE METABOLIC PANEL
ALT: 10 U/L (ref 0–44)
AST: 14 U/L — AB (ref 15–41)
Albumin: 3.3 g/dL — ABNORMAL LOW (ref 3.5–5.0)
Alkaline Phosphatase: 66 U/L (ref 38–126)
Anion gap: 8 (ref 5–15)
BUN: 20 mg/dL (ref 8–23)
CHLORIDE: 106 mmol/L (ref 98–111)
CO2: 23 mmol/L (ref 22–32)
CREATININE: 1.01 mg/dL (ref 0.61–1.24)
Calcium: 9.3 mg/dL (ref 8.9–10.3)
GFR calc Af Amer: >60 mL/min (ref >60)
GFR calc non Af Amer: >60 mL/min (ref >60)
Glucose, Bld: 149 mg/dL — ABNORMAL HIGH (ref 70–99)
Potassium: 4.2 mmol/L (ref 3.5–5.1)
SODIUM: 137 mmol/L (ref 135–145)
Total Bilirubin: 0.6 mg/dL (ref 0.3–1.2)
Total Protein: 7.9 g/dL (ref 6.5–8.1)

## 2018-05-23 LAB — CBC
HCT: 53.9 % — ABNORMAL HIGH (ref 39.0–52.0)
Hemoglobin: 16.1 g/dL (ref 13.0–17.0)
MCH: 24.2 pg — ABNORMAL LOW (ref 26.0–34.0)
MCHC: 29.9 g/dL — ABNORMAL LOW (ref 30.0–36.0)
MCV: 81.2 fL (ref 80.0–100.0)
NRBC: 0 % (ref 0.0–0.2)
PLATELETS: 212 10*3/uL (ref 150–400)
RBC: 6.64 MIL/uL — AB (ref 4.22–5.81)
RDW: 18.2 % — ABNORMAL HIGH (ref 11.5–15.5)
WBC: 10.9 10*3/uL — AB (ref 4.0–10.5)

## 2018-05-23 LAB — URINALYSIS, ROUTINE W REFLEX MICROSCOPIC
Bilirubin Urine: NEGATIVE
GLUCOSE, UA: NEGATIVE mg/dL
Ketones, ur: NEGATIVE mg/dL
Leukocytes,Ua: NEGATIVE
Nitrite: NEGATIVE
Protein, ur: NEGATIVE mg/dL
SPECIFIC GRAVITY, URINE: 1.01 (ref 1.005–1.030)
pH: 6 (ref 5.0–8.0)

## 2018-05-23 LAB — URINALYSIS, MICROSCOPIC (REFLEX)

## 2018-05-23 LAB — LACTIC ACID, PLASMA: Lactic Acid, Venous: 1.2 mmol/L (ref 0.5–1.9)

## 2018-05-23 SURGERY — CYSTOURETEROSCOPY, WITH RETROGRADE PYELOGRAM AND STENT INSERTION
Anesthesia: General | Laterality: Left

## 2018-05-23 MED ORDER — ACETAMINOPHEN 500 MG PO TABS
1000.0000 mg | ORAL_TABLET | Freq: Once | ORAL | Status: AC
  Administered 2018-05-23: 1000 mg via ORAL
  Filled 2018-05-23: qty 2

## 2018-05-23 MED ORDER — DIPHENHYDRAMINE HCL 50 MG/ML IJ SOLN: 12.5000 mg | Freq: Four times a day (QID) | INTRAMUSCULAR | Status: DC | PRN

## 2018-05-23 MED ORDER — BELLADONNA ALKALOIDS-OPIUM 16.2-60 MG RE SUPP: 1.0000 | Freq: Four times a day (QID) | RECTAL | Status: DC | PRN

## 2018-05-23 MED ORDER — FENTANYL CITRATE (PF) 100 MCG/2ML IJ SOLN: 25.0000 ug | INTRAMUSCULAR | Status: DC | PRN

## 2018-05-23 MED ORDER — SODIUM CHLORIDE 0.9 % IV SOLN: INTRAVENOUS | Status: DC | PRN

## 2018-05-23 MED ORDER — ONDANSETRON HCL 4 MG/2ML IJ SOLN
INTRAMUSCULAR | Status: DC | PRN
  Administered 2018-05-23: 4 mg via INTRAVENOUS

## 2018-05-23 MED ORDER — HYDROCODONE-ACETAMINOPHEN 5-325 MG PO TABS
1.0000 | ORAL_TABLET | Freq: Once | ORAL | Status: AC
  Administered 2018-05-23: 1 via ORAL
  Filled 2018-05-23: qty 1

## 2018-05-23 MED ORDER — IOHEXOL 300 MG/ML  SOLN
100.0000 mL | Freq: Once | INTRAMUSCULAR | Status: AC | PRN
  Administered 2018-05-23: 100 mL via INTRAVENOUS

## 2018-05-23 MED ORDER — FENTANYL CITRATE (PF) 100 MCG/2ML IJ SOLN
25.0000 ug | INTRAMUSCULAR | Status: DC | PRN
  Administered 2018-05-23 (×2): 50 ug via INTRAVENOUS

## 2018-05-23 MED ORDER — PHENYLEPHRINE 40 MCG/ML (10ML) SYRINGE FOR IV PUSH (FOR BLOOD PRESSURE SUPPORT)
PREFILLED_SYRINGE | INTRAVENOUS | Status: AC
  Filled 2018-05-23: qty 10

## 2018-05-23 MED ORDER — ONDANSETRON HCL 4 MG/2ML IJ SOLN: 4.0000 mg | INTRAMUSCULAR | Status: DC | PRN

## 2018-05-23 MED ORDER — PROPOFOL 10 MG/ML IV BOLUS
INTRAVENOUS | Status: AC
  Filled 2018-05-23: qty 20

## 2018-05-23 MED ORDER — SODIUM CHLORIDE 0.9 % IV SOLN
INTRAVENOUS | Status: DC
  Administered 2018-05-23: 20:00:00 via INTRAVENOUS

## 2018-05-23 MED ORDER — PHENYLEPHRINE 40 MCG/ML (10ML) SYRINGE FOR IV PUSH (FOR BLOOD PRESSURE SUPPORT)
PREFILLED_SYRINGE | INTRAVENOUS | Status: DC | PRN
  Administered 2018-05-23: 120 ug via INTRAVENOUS
  Administered 2018-05-23 (×2): 80 ug via INTRAVENOUS

## 2018-05-23 MED ORDER — IOHEXOL 300 MG/ML  SOLN
INTRAMUSCULAR | Status: DC | PRN
  Administered 2018-05-23: 4 mL

## 2018-05-23 MED ORDER — FENTANYL CITRATE (PF) 100 MCG/2ML IJ SOLN
INTRAMUSCULAR | Status: AC
  Filled 2018-05-23: qty 2

## 2018-05-23 MED ORDER — ONDANSETRON HCL 4 MG/2ML IJ SOLN
INTRAMUSCULAR | Status: AC
  Filled 2018-05-23: qty 2

## 2018-05-23 MED ORDER — ZOLPIDEM TARTRATE 5 MG PO TABS: 5.0000 mg | ORAL_TABLET | Freq: Every evening | ORAL | Status: DC | PRN

## 2018-05-23 MED ORDER — SODIUM CHLORIDE 0.9 % IV BOLUS
1000.0000 mL | Freq: Once | INTRAVENOUS | Status: AC
  Administered 2018-05-23: 1000 mL via INTRAVENOUS

## 2018-05-23 MED ORDER — EPHEDRINE 5 MG/ML INJ
INTRAVENOUS | Status: AC
  Filled 2018-05-23: qty 10

## 2018-05-23 MED ORDER — DEXAMETHASONE SODIUM PHOSPHATE 10 MG/ML IJ SOLN
INTRAMUSCULAR | Status: AC
  Filled 2018-05-23: qty 1

## 2018-05-23 MED ORDER — SUCCINYLCHOLINE CHLORIDE 200 MG/10ML IV SOSY
PREFILLED_SYRINGE | INTRAVENOUS | Status: DC | PRN
  Administered 2018-05-23: 80 mg via INTRAVENOUS

## 2018-05-23 MED ORDER — LIDOCAINE 2% (20 MG/ML) 5 ML SYRINGE
INTRAMUSCULAR | Status: DC | PRN
  Administered 2018-05-23: 50 mg via INTRAVENOUS

## 2018-05-23 MED ORDER — FENTANYL CITRATE (PF) 100 MCG/2ML IJ SOLN
50.0000 ug | Freq: Once | INTRAMUSCULAR | Status: AC
  Administered 2018-05-23: 50 ug via INTRAVENOUS
  Filled 2018-05-23: qty 2

## 2018-05-23 MED ORDER — OXYCODONE-ACETAMINOPHEN 5-325 MG PO TABS: 1.0000 | ORAL_TABLET | ORAL | Status: DC | PRN

## 2018-05-23 MED ORDER — LACTATED RINGERS IV SOLN
INTRAVENOUS | Status: DC
  Administered 2018-05-23: 17:00:00 via INTRAVENOUS

## 2018-05-23 MED ORDER — SODIUM CHLORIDE 0.9 % IV SOLN
2.0000 g | Freq: Once | INTRAVENOUS | Status: AC
  Administered 2018-05-23: 2 g via INTRAVENOUS
  Filled 2018-05-23: qty 20

## 2018-05-23 MED ORDER — EPHEDRINE SULFATE-NACL 50-0.9 MG/10ML-% IV SOSY
PREFILLED_SYRINGE | INTRAVENOUS | Status: DC | PRN
  Administered 2018-05-23 (×2): 10 mg via INTRAVENOUS

## 2018-05-23 MED ORDER — DEXAMETHASONE SODIUM PHOSPHATE 10 MG/ML IJ SOLN
INTRAMUSCULAR | Status: DC | PRN
  Administered 2018-05-23: 6 mg via INTRAVENOUS

## 2018-05-23 MED ORDER — FENTANYL CITRATE (PF) 100 MCG/2ML IJ SOLN
INTRAMUSCULAR | Status: DC | PRN
  Administered 2018-05-23: 50 ug via INTRAVENOUS

## 2018-05-23 MED ORDER — DIPHENHYDRAMINE HCL 12.5 MG/5ML PO ELIX: 12.5000 mg | ORAL_SOLUTION | Freq: Four times a day (QID) | ORAL | Status: DC | PRN

## 2018-05-23 MED ORDER — PROPOFOL 10 MG/ML IV BOLUS
INTRAVENOUS | Status: DC | PRN
  Administered 2018-05-23: 20 mg via INTRAVENOUS
  Administered 2018-05-23: 130 mg via INTRAVENOUS

## 2018-05-23 MED ORDER — SODIUM CHLORIDE 0.9 % IR SOLN
Status: DC | PRN
  Administered 2018-05-23: 3000 mL

## 2018-05-23 MED ORDER — ONDANSETRON HCL 4 MG/2ML IJ SOLN: 4.0000 mg | Freq: Once | INTRAMUSCULAR | Status: DC | PRN

## 2018-05-23 MED ORDER — ACETAMINOPHEN 325 MG PO TABS: 650.0000 mg | ORAL_TABLET | ORAL | Status: DC | PRN

## 2018-05-23 SURGICAL SUPPLY — 22 items
BAG URO CATCHER STRL LF (MISCELLANEOUS) ×3 IMPLANT
CATH FOLEY 2WAY SLVR  5CC 16FR (CATHETERS) ×2
CATH FOLEY 2WAY SLVR 5CC 16FR (CATHETERS) ×1 IMPLANT
CATH INTERMIT  6FR 70CM (CATHETERS) ×3 IMPLANT
CLOTH BEACON ORANGE TIMEOUT ST (SAFETY) ×3 IMPLANT
COVER WAND RF STERILE (DRAPES) IMPLANT
EXTRACTOR STONE NITINOL NGAGE (UROLOGICAL SUPPLIES) IMPLANT
FIBER LASER TRAC TIP (UROLOGICAL SUPPLIES) IMPLANT
GLOVE BIO SURGEON STRL SZ8 (GLOVE) ×3 IMPLANT
GOWN STRL REUS W/TWL XL LVL3 (GOWN DISPOSABLE) ×3 IMPLANT
GUIDEWIRE ANG ZIPWIRE 038X150 (WIRE) ×3 IMPLANT
GUIDEWIRE STR DUAL SENSOR (WIRE) ×3 IMPLANT
IV NS 1000ML (IV SOLUTION) ×2
IV NS 1000ML BAXH (IV SOLUTION) ×1 IMPLANT
KIT TURNOVER KIT A (KITS) IMPLANT
MANIFOLD NEPTUNE II (INSTRUMENTS) ×3 IMPLANT
PACK CYSTO (CUSTOM PROCEDURE TRAY) ×3 IMPLANT
SHEATH URETERAL 12FRX35CM (MISCELLANEOUS) IMPLANT
STENT URET 6FRX26 CONTOUR (STENTS) ×3 IMPLANT
TUBE FEEDING 8FR 16IN STR KANG (MISCELLANEOUS) IMPLANT
TUBING CONNECTING 10 (TUBING) ×2 IMPLANT
TUBING CONNECTING 10' (TUBING) ×1

## 2018-05-23 NOTE — ED Triage Notes (Signed)
Pt reports L lower back pain radiating into his abd for several weeks. He reports he has not felt quite right for several months, has had decreased appetite and 15lb weight loss.

## 2018-05-23 NOTE — Anesthesia Postprocedure Evaluation (Signed)
Anesthesia Post Note  Patient: Derrick Schultz  Procedure(s) Performed: CYSTOSCOPY WITH RETROGRADE PYELOGRAM, URETEROSCOPY,  AND STENT PLACEMENT (Left )     Patient location during evaluation: PACU Anesthesia Type: General Level of consciousness: awake and alert Pain management: pain level controlled Vital Signs Assessment: post-procedure vital signs reviewed and stable Respiratory status: spontaneous breathing, nonlabored ventilation, respiratory function stable and patient connected to nasal cannula oxygen Cardiovascular status: blood pressure returned to baseline and stable Postop Assessment: no apparent nausea or vomiting Anesthetic complications: no    Last Vitals:  Vitals:   05/23/18 1435 05/23/18 1835  BP: (!) 176/86   Pulse: 68 (P) 94  Resp: 16   Temp:  (P) 36.4 C  SpO2: 96% (P) 96%    Last Pain:  Vitals:   05/23/18 1701  TempSrc:   PainSc: 5                  Catalina Gravel

## 2018-05-23 NOTE — ED Provider Notes (Signed)
[facility] DEPT Provider Note   CSN: [id number] Arrival date & time: 05/23/18  4503    History   Chief Complaint Chief Complaint  Patient presents with   Back Pain    HPI Derrick Schultz is a 85 y.o. male.     HPI   He was transferred from [facility] emergency department to see urology regarding obstructing kidney stone, with concern for urinary tract infection.  Obstruction is high-grade, left proximal ureter.  Also apparent new left kidney mass possibly renal cell carcinoma.  Patient reports vague lower abdominal and back pain for several days, which initiated the visit today.  He denies fever, chills, nausea or vomiting.  Remote history of kidney stone disease.  Recent weight loss about 15 pounds.  There are no other known modifying factors.  Past Medical History:  Diagnosis Date   Elevated PSA    history of elevated; saw urology   History of colonic polyps    History of nephrolithiasis    lithotripsy   Hypertension    Skin lesion    of right templs- Dr. Baltazar Najjar    Patient Active Problem List   Diagnosis Date Noted   Strain of left trapezius muscle 12/12/2017   Screening, ischemic heart disease 04/08/2014   Hyperlipidemia 03/26/2013   Non-healing skin lesion 12/05/2010   Annual physical exam 09/10/2010   INTERMITTENT VERTIGO 03/09/2010   ACTINIC KERATOSIS 10/25/2009   DEGENERATIVE JOINT DISEASE, HANDS 10/25/2009   PROSTATE SPECIFIC ANTIGEN, ELEVATED 04/30/2009   Essential hypertension 04/28/2009   BPH (benign prostatic hyperplasia) 04/28/2009   COLONIC POLYPS, HX OF 04/28/2009   NEPHROLITHIASIS, HX OF 04/28/2009    Past Surgical History:  Procedure Laterality Date   COLONOSCOPY     x 2 intial showed polyps   HERNIA REPAIR     right inguinal        Home Medications    Prior to Admission medications   Medication Sig Start Date End Date Taking? Authorizing Provider  azithromycin (ZITHROMAX) 250  MG tablet Take 2 tablets by mouth on day 1, followed by 1 tablet by mouth daily for 4 days. 03/19/18   Saguier, Percell Miller, PA-C  benzonatate (TESSALON) 100 MG capsule Take 1 capsule (100 mg total) by mouth 3 (three) times daily as needed for cough. 03/19/18   Saguier, Percell Miller, PA-C  fluticasone (FLONASE) 50 MCG/ACT nasal spray Place 2 sprays into both nostrils daily. 03/19/18   Saguier, Percell Miller, PA-C    Family History Family History  Problem Relation Age of Onset   Suicidality Father        father committed suicide whe pt was 75 y/o   Alcohol abuse Father    Angina Mother    Breast cancer Other    Coronary artery disease Other        male 1st degree relative died of MI age 41   Hypertension Other    Cancer Maternal Aunt     Social History Social History   Tobacco Use   Smoking status: Never Smoker   Smokeless tobacco: Never Used  Substance Use Topics   Alcohol use: No   Drug use: No     Allergies   Patient has no known allergies.   Review of Systems Review of Systems  All other systems reviewed and are negative.    Physical Exam Updated Vital Signs BP (!) 176/86 (BP Location: Left Arm)    Pulse 68    Temp 98.4 F (36.9 C)  Resp 16    Ht 5\' 6"  (1.676 m)    Wt 59 kg    SpO2 96%    BMI 20.98 kg/m   Physical Exam Vitals signs and nursing note reviewed.  Constitutional:      Appearance: He is well-developed.  HENT:     Head: Normocephalic and atraumatic.     Right Ear: External ear normal.     Left Ear: External ear normal.  Eyes:     Conjunctiva/sclera: Conjunctivae normal.     Pupils: Pupils are equal, round, and reactive to light.  Neck:     Musculoskeletal: Normal range of motion and neck supple.     Trachea: Phonation normal.  Cardiovascular:     Rate and Rhythm: Normal rate and regular rhythm.     Heart sounds: Normal heart sounds.  Pulmonary:     Effort: Pulmonary effort is normal.     Breath sounds: Normal breath sounds.  Abdominal:      Palpations: Abdomen is soft.     Tenderness: There is no abdominal tenderness.  Musculoskeletal: Normal range of motion.  Skin:    General: Skin is warm and dry.  Neurological:     Mental Status: He is alert and oriented to person, place, and time.     Cranial Nerves: No cranial nerve deficit.     Sensory: No sensory deficit.     Motor: No abnormal muscle tone.     Coordination: Coordination normal.  Psychiatric:        Mood and Affect: Mood normal.        Behavior: Behavior normal.        Thought Content: Thought content normal.        Judgment: Judgment normal.      ED Treatments / Results  Labs (all labs ordered are listed, but only abnormal results are displayed) Labs Reviewed  CBC - Abnormal; Notable for the following components:      Result Value   WBC 10.9 (*)    RBC 6.64 (*)    HCT 53.9 (*)    MCH 24.2 (*)    MCHC 29.9 (*)    RDW 18.2 (*)    All other components within normal limits  COMPREHENSIVE METABOLIC PANEL - Abnormal; Notable for the following components:   Glucose, Bld 149 (*)    Albumin 3.3 (*)    AST 14 (*)    All other components within normal limits  URINALYSIS, ROUTINE W REFLEX MICROSCOPIC - Abnormal; Notable for the following components:   Hgb urine dipstick LARGE (*)    All other components within normal limits  URINALYSIS, MICROSCOPIC (REFLEX) - Abnormal; Notable for the following components:   Bacteria, UA RARE (*)    All other components within normal limits  LACTIC ACID, PLASMA    EKG EKG Interpretation  Date/Time:  Thursday May 23 2018 08:59:16 EDT Ventricular Rate:  62 PR Interval:    QRS Duration: 102 QT Interval:  415 QTC Calculation: 422 R Axis:   -28 Text Interpretation:  Sinus rhythm Atrial premature complex Probable left atrial enlargement Borderline left axis deviation Confirmed by Gerlene Fee [phone]) on 05/23/2018 9:34:00 AM   Radiology Ct Abdomen Pelvis W Contrast  Result Date: 05/23/2018 CLINICAL DATA:   LEFT-sided abdominal pain and flank pain. EXAM: CT ABDOMEN AND PELVIS WITH CONTRAST TECHNIQUE: Multidetector CT imaging of the abdomen and pelvis was performed using the standard protocol following bolus administration of intravenous contrast. CONTRAST:  144mL OMNIPAQUE IOHEXOL 300 MG/ML  SOLN COMPARISON:  None. FINDINGS: Lower chest: Lung bases are clear. Hepatobiliary: Hypervascular lesion in the subcapsular RIGHT hepatic lobe measures 7 mm (image 18/2). Pancreas: Pancreas is normal. No ductal dilatation. No pancreatic inflammation. Spleen: Normal spleen Adrenals/urinary tract: Adrenal glands normal. Rounded enhancing lobular mass replaces the upper pole of the LEFT kidney mass measures 7.8 by 6.5 by 4.9 cm. Mass extends into the LEFT renal hilum. A subtle filling defect within the LEFT renal vein (image 36/5 is linear). Mass contained within the pararenal space. There is mild hydronephrosis of the LEFT kidney. There is a obstructing calculus in the proximal LEFT ureter measuring 4 mm (image 48/2). This calcifications in tubular shaped which could indicate a cast (image 38/5). There is mild enhancement of the urothelium of the proximal ureter and renal pelvis (image 38 and 41 of series 2). The more distal LEFT ureter normal. RIGHT kidney is normal. Nonenhancing cysts in the cortex of the RIGHT kidney. RIGHT ureter normal. Bladder normal. Stomach/Bowel: Stomach, small bowel, appendix, and cecum are normal. The colon and rectosigmoid colon are normal. Vascular/Lymphatic: Abdominal aorta is normal caliber. No periportal or retroperitoneal adenopathy. No pelvic adenopathy. Reproductive: Prostate enlarged measuring 54 mm in diameter Other: No retroperitoneal adenopathy. Musculoskeletal: Small sclerotic lesion in the RIGHT iliac bone measures 8 mm in very dense favoring a benign enostosis. IMPRESSION: 1. Lobular enhancing mass replacing the upper and midpole of the LEFT kidney extending into the LEFT renal hilum  consistent with renal neoplasm. Favor malignant neoplasm (renal cell carcinoma). Questionable extension into the LEFT renal vein with a small linear filling defect. No lymphadenopathy. Mass contained within the pararenal space. 2. Proximal ureteral obstruction on the LEFT by a calculus in the proximal LEFT ureter. Minimal excretion of contrast in LEFT kidney consistent with high-grade obstruction. Enhancement of the urothelium of the renal pelvis and proximal ureter could indicate superinfection of the collecting system. 3. Hypervascular lesion in the RIGHT hepatic lobe is indeterminate. Malignancy is not favored. 4. Recommend urology consultation. Findings conveyed toMICHAEL BERO on 05/23/2018  at11:18. Electronically Signed   By: Suzy Bouchard M.D.   On: 05/23/2018 11:19    Procedures Procedures (including critical care time)  Medications Ordered in ED Medications  0.9 %  sodium chloride infusion (has no administration in time range)  fentaNYL (SUBLIMAZE) injection 50 mcg (has no administration in time range)  HYDROcodone-acetaminophen (NORCO/VICODIN) 5-325 MG per tablet 1 tablet (1 tablet Oral Given 05/23/18 0858)  iohexol (OMNIPAQUE) 300 MG/ML solution 100 mL (100 mLs Intravenous Contrast Given 05/23/18 0952)  fentaNYL (SUBLIMAZE) injection 50 mcg (50 mcg Intravenous Given 05/23/18 1019)  sodium chloride 0.9 % bolus 1,000 mL (1,000 mLs Intravenous New Bag/Given 05/23/18 1044)  cefTRIAXone (ROCEPHIN) 2 g in sodium chloride 0.9 % 100 mL IVPB (0 g Intravenous Stopped 05/23/18 1240)     Initial Impression / Assessment and Plan / ED Course  I have reviewed the triage vital signs and the nursing notes.  Pertinent labs & imaging results that were available during my care of the patient were reviewed by me and considered in my medical decision making (see chart for details).  Clinical Course as of May 23 1443  Thu May 23, 2018  1205 CT reveals renal mass, most likely renal cell carcinoma.  Also  reveals high-grade proximal ureteral obstruction, with some evidence to suggest infected kidney stone.  Patient continues to be afebrile with normal vital signs, provided with IV fluids, IV ceftriaxone, awaiting urology recommendations, anticipating admission to [facility]  long.   [MB]    Clinical Course User Index [MB] Maudie Flakes, MD        Patient Vitals for the past 24 hrs:  BP Temp Temp src Pulse Resp SpO2 Height Weight  05/23/18 1435 (!) 176/86 -- -- 68 16 96 % -- --  05/23/18 1320 (!) 158/69 98.4 F (36.9 C) -- -- 16 97 % -- --  05/23/18 1100 (!) 191/85 -- -- 69 18 95 % -- --  05/23/18 1030 (!) 199/94 -- -- 70 13 92 % -- --  05/23/18 0822 (!) 175/80 98.1 F (36.7 C) Oral 69 16 98 % -- --  05/23/18 0821 -- -- -- -- -- -- 5\' 6"  (1.676 m) 59 kg      Medical Decision Making: Flank pain with obstructing ureter stone and renal mass. Possible UTI with kidney stone. Concern for potential worsening of UTI if stone unable to pass. Multiple doses of narcotic required in ED.  CRITICAL CARE- No Performed by: Daleen Bo  Nursing Notes Reviewed/ Care Coordinated Applicable Imaging Reviewed Interpretation of Laboratory Data incorporated into ED treatment   Discussed with Urology (Dr. Alyson Ingles)  who will take patient to OR for management.  Final Clinical Impressions(s) / ED Diagnoses   Final diagnoses:  Renal mass  Kidney stone  Obstruction of left ureter    ED Discharge Orders    None       Daleen Bo, MD 05/25/18 1052

## 2018-05-23 NOTE — Progress Notes (Signed)
Pt walked entire length of hallway and back (approx 360 ft) with front wheel walker. Pt tolerated well with no complaints.

## 2018-05-23 NOTE — Transfer of Care (Signed)
Immediate Anesthesia Transfer of Care Note  Patient: Derrick Schultz  Procedure(s) Performed: CYSTOSCOPY WITH RETROGRADE PYELOGRAM, URETEROSCOPY,  AND STENT PLACEMENT (Left )  Patient Location: PACU  Anesthesia Type:General  Level of Consciousness: awake, alert  and patient cooperative  Airway & Oxygen Therapy: Patient Spontanous Breathing and Patient connected to face mask oxygen  Post-op Assessment: Report given to RN and Post -op Vital signs reviewed and stable  Post vital signs: Reviewed and stable  Last Vitals:  Vitals Value Taken Time  BP 153/86 05/23/2018  6:36 PM  Temp    Pulse 94 05/23/2018  6:38 PM  Resp 24 05/23/2018  6:38 PM  SpO2 100 % 05/23/2018  6:38 PM  Vitals shown include unvalidated device data.  Last Pain:  Vitals:   05/23/18 1701  TempSrc:   PainSc: 5       Patients Stated Pain Goal: 5 (13/24/40 1027)  Complications: No apparent anesthesia complications

## 2018-05-23 NOTE — Op Note (Signed)
.  Preoperative diagnosis: Left ureteral stone  Postoperative diagnosis: Same  Procedure: 1 cystoscopy 2. Left retrograde pyelography 3.  Intraoperative fluoroscopy, under one hour, with interpretation 4. Left 6 x 26 JJ stent placement  Attending: Nicolette Bang  Anesthesia: General  Estimated blood loss: None  Drains: Left 6 x 26 JJ ureteral stent without tether, 16 French foley catheter  Specimens: none  Antibiotics: ancef  Findings: left proximal ureteral stone. Moderate hydronephrosis. purulent urine drained from left renal pelvis. No masses/lesions in the bladder. Ureteral orifices in normal anatomic location.  Indications: Patient is a 85 year old male with a history of left ureteral stone and intractable pain.  After discussing treatment options, they decided proceed with left stent placement.  Procedure her in detail: The patient was brought to the operating room and a brief timeout was done to ensure correct patient, correct procedure, correct site.  General anesthesia was administered patient was placed in dorsal lithotomy position.  Their genitalia was then prepped and draped in usual sterile fashion.  A rigid 47 French cystoscope was passed in the urethra and the bladder.  Bladder was inspected free masses or lesions.  the ureteral orifices were in the normal orthotopic locations.  a 6 french ureteral catheter was then instilled into the left ureteral orifice.  a gentle retrograde was obtained and findings noted above.  we then placed a zip wire through the ureteral catheter and advanced up to the renal pelvis.    We then placed a 6 x 26 double-j ureteral stent over the original zip wire.  We then removed the wire and good coil was noted in the the renal pelvis under fluoroscopy and the bladder under direct vision.  A foley catheter was then placed. the bladder was then drained and this concluded the procedure which was well tolerated by patient.  Complications:  None  Condition: Stable, extubated, transferred to PACU  Plan: Patient is to be admitted for IV antibiotics. He will have his stone extraction in 2 weeks.

## 2018-05-23 NOTE — Consult Note (Signed)
Urology Consult  Referring physician: Dr. Eulis Foster Reason for referral: left ureteral calculus, intractable pain  Chief Complaint: Left flank pain  History of Present Illness: Derrick Schultz is a 85yo with a history of nephrolithiasis who presented to the ER with a 2 day history of sharp, intermittent, severe, nonradiating left flank pain. He had associated nausea but no vomiting. No change in LUTS. No fevers/chills/sweats. He has had 4 previous calculi requiring ureteroscopy. CT scan shows a 66mm left proximal ureteral calculus with mild hydronephrosis. WBC count normal. He has had multiple doses of narcotic pain medication which has failed to improve his flank pain.   Past Medical History:  Diagnosis Date  . Elevated PSA    history of elevated; saw urology  . History of colonic polyps   . History of kidney stones   . History of nephrolithiasis    lithotripsy  . Hypertension   . Skin lesion    of right templs- Dr. Baltazar Najjar   Past Surgical History:  Procedure Laterality Date  . COLONOSCOPY     x 2 intial showed polyps  . HERNIA REPAIR     right inguinal    Medications: I have reviewed the patient's current medications. Allergies: No Known Allergies  Family History  Problem Relation Age of Onset  . Suicidality Father        father committed suicide whe pt was 60 y/o  . Alcohol abuse Father   . Angina Mother   . Breast cancer Other   . Coronary artery disease Other        male 1st degree relative died of MI age 57  . Hypertension Other   . Cancer Maternal Aunt    Social History:  reports that he has never smoked. He has never used smokeless tobacco. He reports that he does not drink alcohol or use drugs.  Review of Systems  Gastrointestinal: Positive for nausea.  Genitourinary: Positive for flank pain.  All other systems reviewed and are negative.   Physical Exam:  Vital signs in last 24 hours: Temp:  [98.1 F (36.7 C)-98.4 F (36.9 C)] 98.4 F (36.9 C) (03/19 1320) Pulse  Rate:  [68-70] 68 (03/19 1435) Resp:  [13-18] 16 (03/19 1435) BP: (158-199)/(69-94) 176/86 (03/19 1435) SpO2:  [92 %-98 %] 96 % (03/19 1435) Weight:  [59 kg] 59 kg (03/19 0821) Physical Exam  Constitutional: He is oriented to person, place, and time. He appears well-developed and well-nourished.  HENT:  Head: Normocephalic and atraumatic.  Eyes: Pupils are equal, round, and reactive to light. EOM are normal.  Neck: Normal range of motion. No thyromegaly present.  Cardiovascular: Normal rate and regular rhythm.  Respiratory: Effort normal. No respiratory distress.  GI: Soft. He exhibits no distension.  Musculoskeletal: Normal range of motion.        General: No edema.  Neurological: He is alert and oriented to person, place, and time.  Skin: Skin is warm and dry.  Psychiatric: He has a normal mood and affect. His behavior is normal. Judgment and thought content normal.    Laboratory Data:  Results for orders placed or performed during the hospital encounter of 05/23/18 (from the past 72 hour(s))  CBC     Status: Abnormal   Collection Time: 05/23/18  9:06 AM  Result Value Ref Range   WBC 10.9 (H) 4.0 - 10.5 K/uL   RBC 6.64 (H) 4.22 - 5.81 MIL/uL   Hemoglobin 16.1 13.0 - 17.0 g/dL   HCT 53.9 (H) 39.0 -  52.0 %   MCV 81.2 80.0 - 100.0 fL   MCH 24.2 (L) 26.0 - 34.0 pg   MCHC 29.9 (L) 30.0 - 36.0 g/dL   RDW 18.2 (H) 11.5 - 15.5 %   Platelets 212 150 - 400 K/uL   nRBC 0.0 0.0 - 0.2 %    Comment: Performed at [facility], [address] [place], [place] [address]  Comprehensive metabolic panel     Status: Abnormal   Collection Time: 05/23/18  9:06 AM  Result Value Ref Range   Sodium 137 135 - 145 mmol/L   Potassium 4.2 3.5 - 5.1 mmol/L   Chloride 106 98 - 111 mmol/L   CO2 23 22 - 32 mmol/L   Glucose, Bld 149 (H) 70 - 99 mg/dL   BUN 20 8 - 23 mg/dL   Creatinine, Ser 1.01 0.61 - 1.24 mg/dL   Calcium 9.3 8.9 - 10.3 mg/dL   Total Protein 7.9 6.5 - 8.1 g/dL   Albumin  3.3 (L) 3.5 - 5.0 g/dL   AST 14 (L) 15 - 41 U/L   ALT 10 0 - 44 U/L   Alkaline Phosphatase 66 38 - 126 U/L   Total Bilirubin 0.6 0.3 - 1.2 mg/dL   GFR calc non Af Amer >60 >60 mL/min   GFR calc Af Amer >60 >60 mL/min   Anion gap 8 5 - 15    Comment: Performed at [facility], [address] [place], [place] [address]  Lactic acid, plasma     Status: None   Collection Time: 05/23/18  9:06 AM  Result Value Ref Range   Lactic Acid, Venous 1.2 0.5 - 1.9 mmol/L    Comment: Performed at [facility], [address] [place], [place] [address]  Urinalysis, Routine w reflex microscopic     Status: Abnormal   Collection Time: 05/23/18 11:40 AM  Result Value Ref Range   Color, Urine YELLOW YELLOW   APPearance CLEAR CLEAR   Specific Gravity, Urine 1.010 1.005 - 1.030   pH 6.0 5.0 - 8.0   Glucose, UA NEGATIVE NEGATIVE mg/dL   Hgb urine dipstick LARGE (A) NEGATIVE   Bilirubin Urine NEGATIVE NEGATIVE   Ketones, ur NEGATIVE NEGATIVE mg/dL   Protein, ur NEGATIVE NEGATIVE mg/dL   Nitrite NEGATIVE NEGATIVE   Leukocytes,Ua NEGATIVE NEGATIVE    Comment: Performed at [facility], [address] [place], [place] [address]  Urinalysis, Microscopic (reflex)     Status: Abnormal   Collection Time: 05/23/18 11:40 AM  Result Value Ref Range   RBC / HPF 21-50 0 - 5 RBC/hpf   WBC, UA 6-10 0 - 5 WBC/hpf   Bacteria, UA RARE (A) NONE SEEN   Squamous Epithelial / LPF 0-5 0 - 5    Comment: Performed at [facility], [address] [place], [place] [address]   No results found for this or any previous visit (from the past 240 hour(s)). Creatinine: Recent Labs    05/23/18 0906  CREATININE 1.01   Baseline Creatinine: 1  Impression/Assessment:  85yo with a left ureteral calculus, intractable pain  Plan:  The risks/benefits/alternatives to left ureteroscopic stone extraction was explained to the patient and he understands and wishes to proceed with  surgery  Derrick Schultz 05/23/2018, 5:43 PM

## 2018-05-23 NOTE — Anesthesia Preprocedure Evaluation (Addendum)
Anesthesia Evaluation  Patient identified by MRN, date of birth, ID band Patient awake    Reviewed: Allergy & Precautions, NPO status , Patient's Chart, lab work & pertinent test results  Airway Mallampati: II  TM Distance: >3 FB Neck ROM: Full    Dental  (+) Teeth Intact, Dental Advisory Given   Pulmonary neg pulmonary ROS,    Pulmonary exam normal breath sounds clear to auscultation       Cardiovascular hypertension, Normal cardiovascular exam Rhythm:Regular Rate:Normal     Neuro/Psych negative neurological ROS     GI/Hepatic negative GI ROS, Neg liver ROS,   Endo/Other  negative endocrine ROS  Renal/GU left ureteral stone     Musculoskeletal  (+) Arthritis ,   Abdominal   Peds  Hematology negative hematology ROS (+)   Anesthesia Other Findings Day of surgery medications reviewed with the patient.  Reproductive/Obstetrics                             Anesthesia Physical Anesthesia Plan  ASA: III  Anesthesia Plan: General   Post-op Pain Management:    Induction: Intravenous  PONV Risk Score and Plan: 2 and Dexamethasone and Ondansetron  Airway Management Planned: Oral ETT  Additional Equipment:   Intra-op Plan:   Post-operative Plan: Extubation in OR  Informed Consent: I have reviewed the patients History and Physical, chart, labs and discussed the procedure including the risks, benefits and alternatives for the proposed anesthesia with the patient or authorized representative who has indicated his/her understanding and acceptance.     Dental advisory given  Plan Discussed with: CRNA  Anesthesia Plan Comments:         Anesthesia Quick Evaluation

## 2018-05-23 NOTE — ED Notes (Signed)
Blood cultures sent to lab

## 2018-05-23 NOTE — ED Notes (Signed)
Patient reports he would like something to eat.  Informed patient he has to remain NPO at present.  Patient voiced understanding.

## 2018-05-23 NOTE — ED Provider Notes (Signed)
[facility] Emergency Department Provider Note MRN:  [id number]  Arrival date & time: 05/23/18     Chief Complaint   Back Pain   History of Present Illness   Derrick Schultz is a 85 y.o. year-old male with a history of hypertension presenting to the ED with chief complaint of back pain.  4 to 5 days of persistent, gradual onset, progressively worsening back pain.  The pain is located in the left flank, unchanged with motion of the hip or torso.  Denies pain to the spine.  Denies fever, no chest pain or shortness of breath, no abdominal pain.  Recent dysuria.  Denies numbness or weakness to the arms or legs.  Denies bowel or bladder dysfunction.  Moderate in severity, constant.  Unintentional 15 pound weight loss over the past few weeks.  Review of Systems  A complete 10 system review of systems was obtained and all systems are negative except as noted in the HPI and PMH.   Patient's Health History    Past Medical History:  Diagnosis Date  . Elevated PSA    history of elevated; saw urology  . History of colonic polyps   . History of nephrolithiasis    lithotripsy  . Hypertension   . Skin lesion    of right templs- Dr. Baltazar Najjar    Past Surgical History:  Procedure Laterality Date  . COLONOSCOPY     x 2 intial showed polyps  . HERNIA REPAIR     right inguinal    Family History  Problem Relation Age of Onset  . Suicidality Father        father committed suicide whe pt was 45 y/o  . Alcohol abuse Father   . Angina Mother   . Breast cancer Other   . Coronary artery disease Other        male 1st degree relative died of MI age 64  . Hypertension Other   . Cancer Maternal Aunt     Social History   Socioeconomic History  . Marital status: Widowed    Spouse name: Not on file  . Number of children: 3  . Years of education: Not on file  . Highest education level: Not on file  Occupational History    Employer: RETIRED  Social Needs  .  Financial resource strain: Not on file  . Food insecurity:    Worry: Not on file    Inability: Not on file  . Transportation needs:    Medical: Not on file    Non-medical: Not on file  Tobacco Use  . Smoking status: Never Smoker  . Smokeless tobacco: Never Used  Substance and Sexual Activity  . Alcohol use: No  . Drug use: No  . Sexual activity: Not on file  Lifestyle  . Physical activity:    Days per week: Not on file    Minutes per session: Not on file  . Stress: Not on file  Relationships  . Social connections:    Talks on phone: Not on file    Gets together: Not on file    Attends religious service: Not on file    Active member of club or organization: Not on file    Attends meetings of clubs or organizations: Not on file    Relationship status: Not on file  . Intimate partner violence:    Fear of current or ex partner: Not on file    Emotionally abused: Not on file  Physically abused: Not on file    Forced sexual activity: Not on file  Other Topics Concern  . Not on file  Social History Narrative   Retired - worked as Games developer   Widowed since 2005   2 daughters, 1 son  (son lives nearby)   Never Smoked    Alcohol use-no      Physical Exam  Vital Signs and Nursing Notes reviewed Vitals:   05/23/18 1030 05/23/18 1100  BP: (!) 199/94 (!) 191/85  Pulse: 70 69  Resp: 13 18  Temp:    SpO2: 92% 95%    CONSTITUTIONAL: Chronically ill-appearing, NAD NEURO:  Alert and oriented x 3, no focal deficits EYES:  eyes equal and reactive ENT/NECK:  no LAD, no JVD CARDIO: Regular rate, well-perfused, normal S1 and S2 PULM:  CTAB no wheezing or rhonchi GI/GU:  normal bowel sounds, non-distended, non-tender MSK/SPINE:  No gross deformities, no edema SKIN:  no rash, atraumatic PSYCH:  Appropriate speech and behavior  Diagnostic and Interventional Summary    EKG Interpretation  Date/Time:  Thursday May 23 2018 08:59:16 EDT Ventricular Rate:  62 PR Interval:     QRS Duration: 102 QT Interval:  415 QTC Calculation: 422 R Axis:   -28 Text Interpretation:  Sinus rhythm Atrial premature complex Probable left atrial enlargement Borderline left axis deviation Confirmed by Gerlene Fee [phone]) on 05/23/2018 9:34:00 AM      Labs Reviewed  CBC - Abnormal; Notable for the following components:      Result Value   WBC 10.9 (*)    RBC 6.64 (*)    HCT 53.9 (*)    MCH 24.2 (*)    MCHC 29.9 (*)    RDW 18.2 (*)    All other components within normal limits  COMPREHENSIVE METABOLIC PANEL - Abnormal; Notable for the following components:   Glucose, Bld 149 (*)    Albumin 3.3 (*)    AST 14 (*)    All other components within normal limits  URINALYSIS, ROUTINE W REFLEX MICROSCOPIC - Abnormal; Notable for the following components:   Hgb urine dipstick LARGE (*)    All other components within normal limits  URINALYSIS, MICROSCOPIC (REFLEX) - Abnormal; Notable for the following components:   Bacteria, UA RARE (*)    All other components within normal limits  LACTIC ACID, PLASMA    CT ABDOMEN PELVIS W CONTRAST  Final Result      Medications  0.9 %  sodium chloride infusion (has no administration in time range)  HYDROcodone-acetaminophen (NORCO/VICODIN) 5-325 MG per tablet 1 tablet (1 tablet Oral Given 05/23/18 0858)  iohexol (OMNIPAQUE) 300 MG/ML solution 100 mL (100 mLs Intravenous Contrast Given 05/23/18 0952)  fentaNYL (SUBLIMAZE) injection 50 mcg (50 mcg Intravenous Given 05/23/18 1019)  sodium chloride 0.9 % bolus 1,000 mL (1,000 mLs Intravenous New Bag/Given 05/23/18 1044)  cefTRIAXone (ROCEPHIN) 2 g in sodium chloride 0.9 % 100 mL IVPB (2 g Intravenous New Bag/Given 05/23/18 1137)     Procedures Critical Care Critical Care Documentation Critical care time provided by me (excluding procedures): 34 minutes  Condition necessitating critical care: High-grade ureteral obstruction requiring urgent specialty care/surgery  Components of critical care  management: reviewing of prior records, laboratory and imaging interpretation, frequent re-examination and reassessment of vital signs, administration of IV fluids, IV antibiotics, IV opioids, discussion with consulting services, facilitation of transfer    ED Course and Medical Decision Making  I have reviewed the triage vital signs and the nursing notes.  Pertinent labs & imaging results that were available during my care of the patient were reviewed by me and considered in my medical decision making (see below for details).  Kidney stone versus Pilo versus prostate cancer versus worsening of his known small AAA, CT imaging pending.  Clinical Course as of May 22 1236  Thu May 23, 2018  1205 CT reveals renal mass, most likely renal cell carcinoma.  Also reveals high-grade proximal ureteral obstruction, with some evidence to suggest infected kidney stone.  Patient continues to be afebrile with normal vital signs, provided with IV fluids, IV ceftriaxone, awaiting urology recommendations, anticipating admission to [facility] long.   [MB]    Clinical Course User Index [MB] Maudie Flakes, MD    Spoke with Dr. Alyson Ingles of urology, who recommends ED to ED transfer if pain cannot be controlled.  This is indeed the case as patient is requiring IV opioids.  To be evaluated there by urology and likely go to the operating room thereafter.  Accepting ED physician Dr. Alvino Chapel.  Barth Kirks. Sedonia Small, [place] [email or web address]  Final Clinical Impressions(s) / ED Diagnoses     ICD-10-CM   1. Renal mass N28.89   2. Kidney stone N20.0   3. Obstruction of left ureter N13.5     ED Discharge Orders    None         Maudie Flakes, MD 05/23/18 1239

## 2018-05-23 NOTE — Anesthesia Procedure Notes (Signed)
Procedure Name: Intubation Date/Time: 05/23/2018 6:03 PM Performed by: West Pugh, CRNA Pre-anesthesia Checklist: Patient identified, Emergency Drugs available, Suction available, Patient being monitored and Timeout performed Patient Re-evaluated:Patient Re-evaluated prior to induction Oxygen Delivery Method: Circle system utilized Preoxygenation: Pre-oxygenation with 100% oxygen Induction Type: IV induction and Rapid sequence Laryngoscope Size: Mac and 4 Grade View: Grade II Tube type: Oral Tube size: 7.5 mm Number of attempts: 1 Airway Equipment and Method: Stylet Placement Confirmation: ETT inserted through vocal cords under direct vision,  positive ETCO2,  CO2 detector and breath sounds checked- equal and bilateral Secured at: 22 cm Tube secured with: Tape Dental Injury: Teeth and Oropharynx as per pre-operative assessment

## 2018-05-24 ENCOUNTER — Encounter (HOSPITAL_COMMUNITY): Payer: Self-pay | Admitting: Urology

## 2018-05-24 LAB — BASIC METABOLIC PANEL
Anion gap: 6 (ref 5–15)
BUN: 23 mg/dL (ref 8–23)
CO2: 23 mmol/L (ref 22–32)
CREATININE: 1.01 mg/dL (ref 0.61–1.24)
Calcium: 9.1 mg/dL (ref 8.9–10.3)
Chloride: 109 mmol/L (ref 98–111)
GFR calc Af Amer: >60 mL/min (ref >60)
GFR calc non Af Amer: >60 mL/min (ref >60)
Glucose, Bld: 127 mg/dL — ABNORMAL HIGH (ref 70–99)
Potassium: 4.1 mmol/L (ref 3.5–5.1)
Sodium: 138 mmol/L (ref 135–145)

## 2018-05-24 LAB — CBC
HCT: 51.7 % (ref 39.0–52.0)
Hemoglobin: 15.3 g/dL (ref 13.0–17.0)
MCH: 24.4 pg — ABNORMAL LOW (ref 26.0–34.0)
MCHC: 29.6 g/dL — ABNORMAL LOW (ref 30.0–36.0)
MCV: 82.6 fL (ref 80.0–100.0)
PLATELETS: 197 10*3/uL (ref 150–400)
RBC: 6.26 MIL/uL — ABNORMAL HIGH (ref 4.22–5.81)
RDW: 18.5 % — ABNORMAL HIGH (ref 11.5–15.5)
WBC: 9.5 10*3/uL (ref 4.0–10.5)
nRBC: 0 % (ref 0.0–0.2)

## 2018-05-24 MED ORDER — OXYCODONE-ACETAMINOPHEN 5-325 MG PO TABS: 1.0000 | ORAL_TABLET | ORAL | 0 refills | Status: DC | PRN

## 2018-05-24 MED ORDER — TAMSULOSIN HCL 0.4 MG PO CAPS: 0.4000 mg | ORAL_CAPSULE | Freq: Every day | ORAL | 11 refills | Status: DC

## 2018-05-24 MED ORDER — SULFAMETHOXAZOLE-TRIMETHOPRIM 800-160 MG PO TABS: 1.0000 | ORAL_TABLET | Freq: Two times a day (BID) | ORAL | 0 refills | Status: DC

## 2018-05-24 NOTE — Progress Notes (Signed)
Pt dc/ to home. PIV removed without complication. Pt son at bedside and pt reviewed AVS with RN. Teachback confirmed pt's understanding. Pt escorted off of unit to care and care of son in main lobby.

## 2018-05-24 NOTE — Discharge Instructions (Signed)
Ureteral Stent Implantation, Care After °Refer to this sheet in the next few weeks. These instructions provide you with information about caring for yourself after your procedure. Your health care provider may also give you more specific instructions. Your treatment has been planned according to current medical practices, but problems sometimes occur. Call your health care provider if you have any problems or questions after your procedure. °What can I expect after the procedure? °After the procedure, it is common to have: °· Nausea. °· Mild pain when you urinate. You may feel this pain in your lower back or lower abdomen. Pain should stop within a few minutes after you urinate. This may last for up to 1 week. °· A small amount of blood in your urine for several days. °Follow these instructions at home: ° °Medicines °· Take over-the-counter and prescription medicines only as told by your health care provider. °· If you were prescribed an antibiotic medicine, take it as told by your health care provider. Do not stop taking the antibiotic even if you start to feel better. °· Do not drive for 24 hours if you received a sedative. °· Do not drive or operate heavy machinery while taking prescription pain medicines. °Activity °· Return to your normal activities as told by your health care provider. Ask your health care provider what activities are safe for you. °· Do not lift anything that is heavier than 10 lb (4.5 kg). Follow this limit for 1 week after your procedure, or for as long as told by your health care provider. °General instructions °· Watch for any blood in your urine. Call your health care provider if the amount of blood in your urine increases. °· If you have a catheter: °? Follow instructions from your health care provider about taking care of your catheter and collection bag. °? Do not take baths, swim, or use a hot tub until your health care provider approves. °· Drink enough fluid to keep your urine  clear or pale yellow. °· Keep all follow-up visits as told by your health care provider. This is important. °Contact a health care provider if: °· You have pain that gets worse or does not get better with medicine, especially pain when you urinate. °· You have difficulty urinating. °· You feel nauseous or you vomit repeatedly during a period of more than 2 days after the procedure. °Get help right away if: °· Your urine is dark red or has blood clots in it. °· You are leaking urine (have incontinence). °· The end of the stent comes out of your urethra. °· You cannot urinate. °· You have sudden, sharp, or severe pain in your abdomen or lower back. °· You have a fever. °This information is not intended to replace advice given to you by your health care provider. Make sure you discuss any questions you have with your health care provider. °Document Released: 10/23/2012 Document Revised: 07/29/2015 Document Reviewed: 09/04/2014 °Elsevier Interactive Patient Education © 2019 [employer] Inc[employer] ° °

## 2018-06-04 DIAGNOSIS — N201 Calculus of ureter: Secondary | ICD-10-CM | POA: Diagnosis not present

## 2018-06-04 NOTE — Discharge Summary (Signed)
Physician Discharge Summary  Patient ID: Derrick Schultz MRN: [id number] DOB/AGE: 11-Jun-1930 85 y.o.  Admit date: 05/23/2018 Discharge date: 05/24/2018 Admission Diagnoses: Ureteral calculus Discharge Diagnoses:  Active Problems:   Ureteral calculus   Discharged Condition: good  Hospital Course: The patient tolerated the procedure well and was transferred to the floor on IV pain meds, IV fluid. On POD#1 foley was removed, pt was started on regular diet and they ambulated in the halls.  Prior to discharge the pt was tolerating a regular diet, pain was controlled on PO pain meds, they were ambulating without difficulty, and they had normal bowel function.  Consults: None  Significant Diagnostic Studies: none  Treatments: surgery: ureteral stent placement  Discharge Exam: Blood pressure (!) 151/71, pulse 63, temperature 97.8 F (36.6 C), temperature source Oral, resp. rate 20, height 5\' 7"  (1.702 m), weight 61.9 kg, SpO2 94 %. General appearance: alert, cooperative and appears stated age Head: Normocephalic, without obvious abnormality, atraumatic Ears: normal TM's and external ear canals both ears Neck: no adenopathy, no carotid bruit, no JVD, supple, symmetrical, trachea midline and thyroid not enlarged, symmetric, no tenderness/mass/nodules Cardio: regular rate and rhythm, S1, S2 normal, no murmur, click, rub or gallop GI: soft, non-tender; bowel sounds normal; no masses,  no organomegaly Extremities: extremities normal, atraumatic, no cyanosis or edema Neurologic: Grossly normal  Disposition:    Allergies as of 05/24/2018   No Known Allergies     Medication List    TAKE these medications   acetaminophen 500 MG tablet Commonly known as:  TYLENOL Take 250-500 mg by mouth every 6 (six) hours as needed for mild pain.   azithromycin 250 MG tablet Commonly known as:  ZITHROMAX Take 2 tablets by mouth on day 1, followed by 1 tablet by mouth daily for 4 days.   benzonatate  100 MG capsule Commonly known as:  TESSALON Take 1 capsule (100 mg total) by mouth 3 (three) times daily as needed for cough.   Fish Oil 1000 MG Caps Take 1,000 mg by mouth daily.   fluticasone 50 MCG/ACT nasal spray Commonly known as:  FLONASE Place 2 sprays into both nostrils daily.   oxyCODONE-acetaminophen 5-325 MG tablet Commonly known as:  PERCOCET/ROXICET Take 1-2 tablets by mouth every 4 (four) hours as needed for moderate pain.   sulfamethoxazole-trimethoprim 800-160 MG tablet Commonly known as:  BACTRIM DS,SEPTRA DS Take 1 tablet by mouth 2 (two) times daily.   tamsulosin 0.4 MG Caps capsule Commonly known as:  FLOMAX Take 1 capsule (0.4 mg total) by mouth daily after supper.      Follow-up Information    Derrick Schultz, Derrick Furbish, MD. Call in 2 week(s).   Specialty:  Urology Contact information: [address] [place] [address] [phone]           Signed: Nicolette Schultz 06/04/2018, 9:54 AM

## 2018-06-10 ENCOUNTER — Other Ambulatory Visit: Payer: Self-pay | Admitting: Urology

## 2018-06-19 DIAGNOSIS — N201 Calculus of ureter: Secondary | ICD-10-CM | POA: Diagnosis not present

## 2018-07-17 ENCOUNTER — Other Ambulatory Visit: Payer: Self-pay | Admitting: Urology

## 2018-07-26 NOTE — Patient Instructions (Signed)
Derrick Schultz   Your procedure is scheduled on 08/05/18:    Report to [facility]  Entrance Report to admitting at 10:45 AM   YOU NEED TO HAVE A COVID 19 TEST ON Thurs. 5/28_______, THIS TEST MUST BE DONE BEFORE SURGERY , COME TO [facility] ENTRANCE BETWEEN THE HOURS OF 900 AM AND 300 PM ON YOUR COVID TEST DATE.   Call this number if you have problems the morning of surgery [phone]    Remember: Do not eat food after Midnight.            BRUSH YOUR TEETH MORNING OF SURGERY AND RINSE YOUR MOUTH OUT, NO CHEWING GUM CANDY OR MINTS.   Do not eat food After Midnight.  YOU MAY HAVE CLEAR LIQUIDS FROM MIDNIGHT UNTIL 4:30AM.  At 4:30AM Please finish the prescribed Pre-Surgery Gatorade drink. Nothing by mouth after you finish the Gatorade drink !   Take these medicines the morning of surgery with A SIP OF WATER: Tamsulosin (flomax)  [facility] - Preparing for Surgery Before surgery, you can play an important role.  Because skin is not sterile, your skin needs to be as free of germs as possible.  You can reduce the number of germs on your skin by washing with CHG (chlorahexidine gluconate) soap before surgery.  CHG is an antiseptic cleaner which kills germs and bonds with the skin to continue killing germs even after washing. Please DO NOT use if you have an allergy to CHG or antibacterial soaps.  If your skin becomes reddened/irritated stop using the CHG and inform your nurse when you arrive at Short Stay. Do not shave (including legs and underarms) for at least 48 hours prior to the first CHG shower.  You may shave your face/neck. Please follow these instructions carefully:  1.  Shower with CHG Soap the night before surgery and the  morning of Surgery.  2.  If you choose to wash your hair, wash your hair first as usual with your  normal  shampoo.  3.  After you shampoo, rinse your hair and body thoroughly to remove the  shampoo.                                         4.  Use CHG as you would any other liquid soap.  You can apply chg directly  to the skin and wash                       Gently with a scrungie or clean washcloth.  5.  Apply the CHG Soap to your body ONLY FROM THE NECK DOWN.   Do not use on face/ open                           Wound or open sores. Avoid contact with eyes, ears mouth and genitals (private parts).                       Wash face,  Genitals (private parts) with your normal soap.             6.  Wash thoroughly, paying special attention to the area where your surgery  will be performed.  7.  Thoroughly rinse your body with warm water from the neck down.  8.  DO NOT shower/wash with your normal soap after using and rinsing off  the CHG Soap.             9.  Pat yourself dry with a clean towel.            10.  Wear clean pajamas.            11.  Place clean sheets on your bed the night of your first shower and do not  sleep with pets.  Day of Surgery : Do not apply any lotions/deodorants the morning of surgery.  Please wear clean clothes to the hospital/surgery center.    FAILURE TO FOLLOW THESE INSTRUCTIONS MAY RESULT IN THE CANCELLATION OF YOUR SURGERY PATIENT SIGNATURE_________________________________  NURSE SIGNATURE__________________________________  ________________________________________________________________________                                Dennis Bast may not have any metal on your body including piercings.            Do not wear jewelry,  lotions, powders or  deodorant              Men may shave face and neck.   Do not bring valuables to the hospital. [place].   Contacts, dentures or bridgework may not be worn into surgery.       Patients discharged the day of surgery will not be allowed to drive home.  IF YOU ARE HAVING SURGERY AND GOING HOME THE SAME DAY, YOU MUST HAVE AN ADULT TO DRIVE YOU HOME AND BE WITH YOU FOR 24  HOURS.  YOU MAY GO HOME BY TAXI OR UBER OR ORTHERWISE, BUT AN ADULT MUST ACCOMPANY YOU HOME AND STAY WITH YOU FOR 24 HOURS .  Name and phone number of your driver:   Special Instructions: N/A              Please read over the following fact sheets you were given: _____________________________________________________________________

## 2018-07-30 ENCOUNTER — Encounter (HOSPITAL_COMMUNITY)
Admission: RE | Admit: 2018-07-30 | Discharge: 2018-07-30 | Disposition: A | Discharge status: Home / Self Care | Payer: Medicare HMO | Source: Ambulatory Visit | Attending: Urology | Admitting: Urology

## 2018-07-30 ENCOUNTER — Encounter (HOSPITAL_COMMUNITY): Payer: Self-pay

## 2018-07-30 ENCOUNTER — Other Ambulatory Visit: Payer: Self-pay

## 2018-07-30 ENCOUNTER — Encounter (HOSPITAL_COMMUNITY): Payer: Medicare HMO

## 2018-07-30 DIAGNOSIS — Z01812 Encounter for preprocedural laboratory examination: Secondary | ICD-10-CM | POA: Insufficient documentation

## 2018-07-30 DIAGNOSIS — N201 Calculus of ureter: Secondary | ICD-10-CM | POA: Diagnosis not present

## 2018-07-30 LAB — CBC
HCT: 54.2 % — ABNORMAL HIGH (ref 39.0–52.0)
Hemoglobin: 15.6 g/dL (ref 13.0–17.0)
MCH: 22.7 pg — ABNORMAL LOW (ref 26.0–34.0)
MCHC: 28.8 g/dL — ABNORMAL LOW (ref 30.0–36.0)
MCV: 79 fL — ABNORMAL LOW (ref 80.0–100.0)
Platelets: 239 10*3/uL (ref 150–400)
RBC: 6.86 MIL/uL — ABNORMAL HIGH (ref 4.22–5.81)
RDW: 20.1 % — ABNORMAL HIGH (ref 11.5–15.5)
WBC: 6.8 10*3/uL (ref 4.0–10.5)
nRBC: 0 % (ref 0.0–0.2)

## 2018-07-30 LAB — BASIC METABOLIC PANEL
Anion gap: 7 (ref 5–15)
BUN: 16 mg/dL (ref 8–23)
CO2: 27 mmol/L (ref 22–32)
Calcium: 9.6 mg/dL (ref 8.9–10.3)
Chloride: 102 mmol/L (ref 98–111)
Creatinine, Ser: 1.04 mg/dL (ref 0.61–1.24)
GFR calc Af Amer: >60 mL/min (ref >60)
GFR calc non Af Amer: >60 mL/min (ref >60)
Glucose, Bld: 86 mg/dL (ref 70–99)
Potassium: 4.8 mmol/L (ref 3.5–5.1)
Sodium: 136 mmol/L (ref 135–145)

## 2018-08-01 ENCOUNTER — Other Ambulatory Visit (HOSPITAL_COMMUNITY)
Admission: RE | Admit: 2018-08-01 | Discharge: 2018-08-01 | Disposition: A | Discharge status: Home / Self Care | Payer: Medicare HMO | Source: Ambulatory Visit | Attending: Urology | Admitting: Urology

## 2018-08-01 DIAGNOSIS — Z01812 Encounter for preprocedural laboratory examination: Secondary | ICD-10-CM | POA: Diagnosis not present

## 2018-08-02 LAB — NOVEL CORONAVIRUS, NAA (HOSP ORDER, SEND-OUT TO REF LAB; TAT 18-24 HRS): SARS-CoV-2, NAA: NOT DETECTED

## 2018-08-02 NOTE — Progress Notes (Signed)
SPOKE W/  _patient     SCREENING SYMPTOMS OF COVID 19:   COUGH--no  RUNNY NOSE--- no  SORE THROAT---no  NASAL CONGESTION----no  SNEEZING----no  SHORTNESS OF BREATH---no  DIFFICULTY BREATHING---no  TEMP >100.0 -----no  UNEXPLAINED BODY ACHES------no  CHILLS --------no   HEADACHES ---------no  LOSS OF SMELL/ TASTE --------no    HAVE YOU OR ANY FAMILY MEMBER TRAVELLED PAST 14 DAYS OUT OF THE   COUNTY---no STATE----no COUNTRY----no  HAVE YOU OR ANY FAMILY MEMBER BEEN EXPOSED TO ANYONE WITH COVID 19? no    

## 2018-08-05 ENCOUNTER — Ambulatory Visit (HOSPITAL_COMMUNITY)
Admission: RE | Admit: 2018-08-05 | Discharge: 2018-08-05 | Disposition: A | Discharge status: Home / Self Care | Payer: Medicare HMO | Source: Other Acute Inpatient Hospital | Attending: Urology | Admitting: Urology

## 2018-08-05 ENCOUNTER — Other Ambulatory Visit: Payer: Self-pay

## 2018-08-05 ENCOUNTER — Ambulatory Visit (HOSPITAL_COMMUNITY): Payer: Medicare HMO | Admitting: Certified Registered"

## 2018-08-05 ENCOUNTER — Encounter (HOSPITAL_COMMUNITY): Payer: Self-pay

## 2018-08-05 ENCOUNTER — Ambulatory Visit (HOSPITAL_COMMUNITY): Payer: Medicare HMO

## 2018-08-05 ENCOUNTER — Encounter (HOSPITAL_COMMUNITY)
Admission: RE | Disposition: A | Discharge status: Home / Self Care | Payer: Self-pay | Source: Other Acute Inpatient Hospital | Attending: Urology

## 2018-08-05 ENCOUNTER — Ambulatory Visit (HOSPITAL_COMMUNITY): Payer: Medicare HMO | Admitting: Physician Assistant

## 2018-08-05 DIAGNOSIS — M1990 Unspecified osteoarthritis, unspecified site: Secondary | ICD-10-CM | POA: Diagnosis not present

## 2018-08-05 DIAGNOSIS — E785 Hyperlipidemia, unspecified: Secondary | ICD-10-CM | POA: Diagnosis not present

## 2018-08-05 DIAGNOSIS — I10 Essential (primary) hypertension: Secondary | ICD-10-CM | POA: Insufficient documentation

## 2018-08-05 DIAGNOSIS — Z87442 Personal history of urinary calculi: Secondary | ICD-10-CM | POA: Insufficient documentation

## 2018-08-05 DIAGNOSIS — N400 Enlarged prostate without lower urinary tract symptoms: Secondary | ICD-10-CM | POA: Diagnosis not present

## 2018-08-05 DIAGNOSIS — N201 Calculus of ureter: Secondary | ICD-10-CM | POA: Diagnosis not present

## 2018-08-05 HISTORY — PX: CYSTOSCOPY WITH RETROGRADE PYELOGRAM, URETEROSCOPY AND STENT PLACEMENT: SHX5789

## 2018-08-05 SURGERY — CYSTOURETEROSCOPY, WITH RETROGRADE PYELOGRAM AND STENT INSERTION
Anesthesia: General | Laterality: Left

## 2018-08-05 MED ORDER — BELLADONNA ALKALOIDS-OPIUM 16.2-30 MG RE SUPP
RECTAL | Status: AC
  Filled 2018-08-05: qty 1

## 2018-08-05 MED ORDER — FENTANYL CITRATE (PF) 100 MCG/2ML IJ SOLN
INTRAMUSCULAR | Status: AC
  Filled 2018-08-05: qty 2

## 2018-08-05 MED ORDER — ACETAMINOPHEN 500 MG PO TABS: 1000.0000 mg | ORAL_TABLET | Freq: Once | ORAL | Status: DC

## 2018-08-05 MED ORDER — DEXAMETHASONE SODIUM PHOSPHATE 10 MG/ML IJ SOLN
INTRAMUSCULAR | Status: DC | PRN
  Administered 2018-08-05: 10 mg via INTRAVENOUS

## 2018-08-05 MED ORDER — LIDOCAINE HCL URETHRAL/MUCOSAL 2 % EX GEL
CUTANEOUS | Status: AC
  Filled 2018-08-05: qty 5

## 2018-08-05 MED ORDER — IOHEXOL 300 MG/ML  SOLN
INTRAMUSCULAR | Status: DC | PRN
  Administered 2018-08-05: 14:00:00 5 mL via URETHRAL

## 2018-08-05 MED ORDER — ACETAMINOPHEN 500 MG PO TABS
1000.0000 mg | ORAL_TABLET | Freq: Once | ORAL | Status: AC
  Administered 2018-08-05: 1000 mg via ORAL
  Filled 2018-08-05: qty 2

## 2018-08-05 MED ORDER — DEXAMETHASONE SODIUM PHOSPHATE 10 MG/ML IJ SOLN
INTRAMUSCULAR | Status: AC
  Filled 2018-08-05: qty 1

## 2018-08-05 MED ORDER — PROPOFOL 10 MG/ML IV BOLUS
INTRAVENOUS | Status: AC
  Filled 2018-08-05: qty 20

## 2018-08-05 MED ORDER — EPHEDRINE SULFATE-NACL 50-0.9 MG/10ML-% IV SOSY
PREFILLED_SYRINGE | INTRAVENOUS | Status: DC | PRN
  Administered 2018-08-05: 10 mg via INTRAVENOUS

## 2018-08-05 MED ORDER — SODIUM CHLORIDE 0.9 % IR SOLN
Status: DC | PRN
  Administered 2018-08-05: 3000 mL

## 2018-08-05 MED ORDER — EPHEDRINE 5 MG/ML INJ
INTRAVENOUS | Status: AC
  Filled 2018-08-05: qty 10

## 2018-08-05 MED ORDER — INDIGOTINDISULFONATE SODIUM 8 MG/ML IJ SOLN
INTRAMUSCULAR | Status: AC
  Filled 2018-08-05: qty 5

## 2018-08-05 MED ORDER — FENTANYL CITRATE (PF) 100 MCG/2ML IJ SOLN
25.0000 ug | INTRAMUSCULAR | Status: DC | PRN
  Administered 2018-08-05: 50 ug via INTRAVENOUS

## 2018-08-05 MED ORDER — OXYCODONE-ACETAMINOPHEN 5-325 MG PO TABS: 1.0000 | ORAL_TABLET | ORAL | 0 refills | Status: DC | PRN

## 2018-08-05 MED ORDER — LACTATED RINGERS IV SOLN
INTRAVENOUS | Status: DC
  Administered 2018-08-05: 11:00:00 via INTRAVENOUS

## 2018-08-05 MED ORDER — LIDOCAINE 2% (20 MG/ML) 5 ML SYRINGE
INTRAMUSCULAR | Status: DC | PRN
  Administered 2018-08-05: 60 mg via INTRAVENOUS

## 2018-08-05 MED ORDER — PHENYLEPHRINE 40 MCG/ML (10ML) SYRINGE FOR IV PUSH (FOR BLOOD PRESSURE SUPPORT)
PREFILLED_SYRINGE | INTRAVENOUS | Status: DC | PRN
  Administered 2018-08-05: 80 ug via INTRAVENOUS

## 2018-08-05 MED ORDER — ONDANSETRON HCL 4 MG/2ML IJ SOLN
INTRAMUSCULAR | Status: DC | PRN
  Administered 2018-08-05: 4 mg via INTRAVENOUS

## 2018-08-05 MED ORDER — PHENYLEPHRINE 40 MCG/ML (10ML) SYRINGE FOR IV PUSH (FOR BLOOD PRESSURE SUPPORT)
PREFILLED_SYRINGE | INTRAVENOUS | Status: AC
  Filled 2018-08-05: qty 10

## 2018-08-05 MED ORDER — ONDANSETRON HCL 4 MG/2ML IJ SOLN: 4.0000 mg | Freq: Once | INTRAMUSCULAR | Status: DC | PRN

## 2018-08-05 MED ORDER — FENTANYL CITRATE (PF) 100 MCG/2ML IJ SOLN
INTRAMUSCULAR | Status: DC | PRN
  Administered 2018-08-05 (×4): 25 ug via INTRAVENOUS

## 2018-08-05 MED ORDER — ONDANSETRON HCL 4 MG/2ML IJ SOLN
INTRAMUSCULAR | Status: AC
  Filled 2018-08-05: qty 2

## 2018-08-05 MED ORDER — SODIUM CHLORIDE 0.9 % IV SOLN
2.0000 g | INTRAVENOUS | Status: AC
  Administered 2018-08-05: 2 g via INTRAVENOUS
  Filled 2018-08-05: qty 20

## 2018-08-05 MED ORDER — PROPOFOL 10 MG/ML IV BOLUS
INTRAVENOUS | Status: DC | PRN
  Administered 2018-08-05: 120 mg via INTRAVENOUS

## 2018-08-05 MED ORDER — 0.9 % SODIUM CHLORIDE (POUR BTL) OPTIME
TOPICAL | Status: DC | PRN
  Administered 2018-08-05: 1000 mL

## 2018-08-05 SURGICAL SUPPLY — 25 items
BAG URO CATCHER STRL LF (MISCELLANEOUS) ×3 IMPLANT
CATH INTERMIT  6FR 70CM (CATHETERS) ×3 IMPLANT
CLOTH BEACON ORANGE TIMEOUT ST (SAFETY) ×3 IMPLANT
COVER SURGICAL LIGHT HANDLE (MISCELLANEOUS) ×3 IMPLANT
COVER WAND RF STERILE (DRAPES) IMPLANT
EXTRACTOR STONE NITINOL NGAGE (UROLOGICAL SUPPLIES) ×3 IMPLANT
FIBER LASER FLEXIVA 1000 (UROLOGICAL SUPPLIES) IMPLANT
FIBER LASER FLEXIVA 365 (UROLOGICAL SUPPLIES) IMPLANT
FIBER LASER FLEXIVA 550 (UROLOGICAL SUPPLIES) IMPLANT
FIBER LASER TRAC TIP (UROLOGICAL SUPPLIES) IMPLANT
GLOVE BIO SURGEON STRL SZ8 (GLOVE) ×3 IMPLANT
GOWN STRL REUS W/TWL XL LVL3 (GOWN DISPOSABLE) ×3 IMPLANT
GUIDEWIRE ANG ZIPWIRE 038X150 (WIRE) ×3 IMPLANT
GUIDEWIRE STR DUAL SENSOR (WIRE) ×3 IMPLANT
IV NS 1000ML (IV SOLUTION) ×2
IV NS 1000ML BAXH (IV SOLUTION) ×1 IMPLANT
KIT TURNOVER KIT A (KITS) IMPLANT
MANIFOLD NEPTUNE II (INSTRUMENTS) ×3 IMPLANT
PACK CYSTO (CUSTOM PROCEDURE TRAY) ×3 IMPLANT
SHEATH URETERAL 12FRX35CM (MISCELLANEOUS) ×3 IMPLANT
STENT URET 6FRX26 CONTOUR (STENTS) ×3 IMPLANT
TUBE FEEDING 8FR 16IN STR KANG (MISCELLANEOUS) IMPLANT
TUBING CONNECTING 10 (TUBING) ×2 IMPLANT
TUBING CONNECTING 10' (TUBING) ×1
TUBING UROLOGY SET (TUBING) ×3 IMPLANT

## 2018-08-05 NOTE — H&P (Signed)
Urology Admission H&P  Chief Complaint: left flank pain  History of Present Illness: Mr Bankhead is a 85yo with a hx left ureteral calculus s/p stent placement for sepsis. He has intermittent left flank pain. He has dysuria, urgency and frequency  Past Medical History:  Diagnosis Date  . Elevated PSA    history of elevated; saw urology  . History of colonic polyps   . History of kidney stones   . History of nephrolithiasis    lithotripsy  . Hypertension    no meds  . Skin lesion    of right templs- Dr. Baltazar Najjar   Past Surgical History:  Procedure Laterality Date  . COLONOSCOPY     x 2 intial showed polyps  . CYSTOSCOPY WITH RETROGRADE PYELOGRAM, URETEROSCOPY AND STENT PLACEMENT Left 05/23/2018   Procedure: CYSTOSCOPY WITH RETROGRADE PYELOGRAM, URETEROSCOPY,  AND STENT PLACEMENT;  Surgeon: Cleon Gustin, MD;  Location: [facility];  Service: Urology;  Laterality: Left;  . HERNIA REPAIR     right inguinal    Home Medications:  Current Facility-Administered Medications  Medication Dose Route Frequency Provider Last Rate Last Dose  . cefTRIAXone (ROCEPHIN) 2 g in sodium chloride 0.9 % 100 mL IVPB  2 g Intravenous 30 min Pre-Op Khaliah Barnick, Candee Furbish, MD      . lactated ringers infusion   Intravenous Continuous Barnet Glasgow, MD 50 mL/hr at 08/05/18 1057     Allergies: No Known Allergies  Family History  Problem Relation Age of Onset  . Suicidality Father        father committed suicide whe pt was 7 y/o  . Alcohol abuse Father   . Angina Mother   . Breast cancer Other   . Coronary artery disease Other        male 1st degree relative died of MI age 8  . Hypertension Other   . Cancer Maternal Aunt    Social History:  reports that he has never smoked. He has never used smokeless tobacco. He reports that he does not drink alcohol or use drugs.  Review of Systems  Genitourinary: Positive for flank pain, frequency and urgency.  All other systems reviewed and are  negative.   Physical Exam:  Vital signs in last 24 hours: Temp:  [98.7 F (37.1 C)] 98.7 F (37.1 C) (06/01 1028) Pulse Rate:  [68] 68 (06/01 1028) Resp:  [18] 18 (06/01 1028) BP: (128)/(75) 128/75 (06/01 1028) SpO2:  [99 %] 99 % (06/01 1028) Weight:  [62.7 kg] 62.7 kg (06/01 1053) Physical Exam  Constitutional: He is oriented to person, place, and time. He appears well-developed and well-nourished.  HENT:  Head: Normocephalic and atraumatic.  Eyes: Pupils are equal, round, and reactive to light. EOM are normal.  Neck: Normal range of motion. No thyromegaly present.  Cardiovascular: Normal rate and regular rhythm.  Respiratory: Effort normal. No respiratory distress.  GI: Soft. He exhibits no distension.  Musculoskeletal: Normal range of motion.        General: No edema.  Neurological: He is alert and oriented to person, place, and time.  Skin: Skin is warm and dry.  Psychiatric: He has a normal mood and affect. His behavior is normal. Judgment and thought content normal.    Laboratory Data:  No results found for this or any previous visit (from the past 24 hour(s)). Recent Results (from the past 240 hour(s))  Novel Coronavirus, NAA (hospital order; send-out to ref lab)     Status: None   Collection  Time: 08/01/18 11:27 AM  Result Value Ref Range Status   SARS-CoV-2, NAA NOT DETECTED NOT DETECTED Final    Comment: (NOTE) This test was developed and its performance characteristics determined by [employer]. This test has not been FDA cleared or approved. This test has been authorized by FDA under an Emergency Use Authorization (EUA). This test is only authorized for the duration of time the declaration that circumstances exist justifying the authorization of the emergency use of in vitro diagnostic tests for detection of SARS-CoV-2 virus and/or diagnosis of COVID-19 infection under section 564(b)(1) of the Act, 21 U.S.C. 161WRU-0(A)(5), unless the authorization is  terminated or revoked sooner. When diagnostic testing is negative, the possibility of a false negative result should be considered in the context of a patient's recent exposures and the presence of clinical signs and symptoms consistent with COVID-19. An individual without symptoms of COVID-19 and who is not shedding SARS-CoV-2 virus would expect to have a negative (not detected) result in this assay. Performed  At: [facility] [address] [place], [place] [id number] Rush Farmer MD NW:[id number]    [place]  Final    Comment: Performed at [facility], [place] [address] [place], [place] [address]   Creatinine: Recent Labs    07/30/18 1043  CREATININE 1.04   Baseline Creatinine: 1  Impression/Assessment:  85yo with left ureteral calculus  Plan:  The risks/benefits/alterantives to left ureteral stone extraction was explained to the patient and he understands and wishes to proceed with surgery  Nicolette Bang 08/05/2018, 12:43 PM

## 2018-08-05 NOTE — Anesthesia Preprocedure Evaluation (Addendum)
Anesthesia Evaluation  Patient identified by MRN, date of birth, ID band Patient awake    Reviewed: Allergy & Precautions, NPO status , Patient's Chart, lab work & pertinent test results  Airway Mallampati: II  TM Distance: >3 FB Neck ROM: Full    Dental  (+) Teeth Intact, Dental Advisory Given   Pulmonary neg pulmonary ROS,    Pulmonary exam normal breath sounds clear to auscultation       Cardiovascular hypertension, Normal cardiovascular exam Rhythm:Regular Rate:Normal     Neuro/Psych negative neurological ROS  negative psych ROS   GI/Hepatic negative GI ROS, Neg liver ROS,   Endo/Other  negative endocrine ROS  Renal/GU left ureteral stone     Musculoskeletal  (+) Arthritis ,   Abdominal   Peds  Hematology negative hematology ROS (+)   Anesthesia Other Findings Day of surgery medications reviewed with the patient.  Reproductive/Obstetrics                             Anesthesia Physical  Anesthesia Plan  ASA: III  Anesthesia Plan: General   Post-op Pain Management:    Induction: Intravenous  PONV Risk Score and Plan: 2 and Dexamethasone and Ondansetron  Airway Management Planned: LMA  Additional Equipment:   Intra-op Plan:   Post-operative Plan: Extubation in OR  Informed Consent: I have reviewed the patients History and Physical, chart, labs and discussed the procedure including the risks, benefits and alternatives for the proposed anesthesia with the patient or authorized representative who has indicated his/her understanding and acceptance.     Dental advisory given  Plan Discussed with: CRNA  Anesthesia Plan Comments:        Anesthesia Quick Evaluation

## 2018-08-05 NOTE — Op Note (Signed)
.  Preoperative diagnosis: Left ureteral stone  Postoperative diagnosis: Same  Procedure: 1 cystoscopy 2. Left retrograde pyelography 3.  Intraoperative fluoroscopy, under one hour, with interpretation 4.  Left ureteroscopic stone manipulation with basket extraction 5.  Left 6 x 26 JJ stent exchange  Attending: Rosie Fate  Anesthesia: General  Estimated blood loss: None  Drains: Left 6 x 26 JJ ureteral stent with tether  Specimens: stone for analysis  Antibiotics: ancef  Findings: left lower pole stone. No hydronephrosis. No masses/lesions in the bladder. Ureteral orifices in normal anatomic location.  Indications: Patient is a 85 year old male with a history of left ureteral stone and who underwent stent placement for sepsis 3 months ago.  After discussing treatment options, he decided proceed with left ureteroscopic stone manipulation.  Procedure her in detail: The patient was brought to the operating room and a brief timeout was done to ensure correct patient, correct procedure, correct site.  General anesthesia was administered patient was placed in dorsal lithotomy position.  Her genitalia was then prepped and draped in usual sterile fashion.  A rigid 59 French cystoscope was passed in the urethra and the bladder.  Bladder was inspected free masses or lesions.  the ureteral orifices were in the normal orthotopic locations. Using a grasper the left ureteral stent was brought to the urethral meatus. A zipwire was then advanced through the stent and up to the renal pelvis. The stent was then removed. a 6 french ureteral catheter was then instilled into the left ureteral orifice.  a gentle retrograde was obtained and findings noted above.  we then removed the cystoscope and cannulated the left ureteral orifice with a semirigid ureteroscope.  No stone was found in the ureter. Once we reached the UPJ a sensor wire was advanced in to the renal pelvis. We then removed the ureteroscope and  advanced am 12/14 x 35cm access sheath up to the renal pelvis. We then used the flexible ureteroscope to perform nephroscopy. We encountered the stone in the lower pole.    The stone was then removed with a Ngage basket.    once the stone was removed we then removed the access sheath under direct vision and noted no injury to the ureter. We then placed a 6 x 26 double-j ureteral stent over the original zip wire.  We then removed the wire and good coil was noted in the the renal pelvis under fluoroscopy and the bladder under direct vision. the bladder was then drained and this concluded the procedure which was well tolerated by patient.  Complications: None  Condition: Stable, extubated, transferred to PACU  Plan: Patient is to be discharged home as to follow-up in one week. He is to remove his stent by pulling the tether in 72 hours

## 2018-08-05 NOTE — Discharge Instructions (Signed)

## 2018-08-05 NOTE — Anesthesia Procedure Notes (Signed)
Procedure Name: LMA Insertion Date/Time: 08/05/2018 1:02 PM Performed by: West Pugh, CRNA Pre-anesthesia Checklist: Patient identified, Emergency Drugs available, Suction available, Patient being monitored and Timeout performed Patient Re-evaluated:Patient Re-evaluated prior to induction Oxygen Delivery Method: Circle system utilized Preoxygenation: Pre-oxygenation with 100% oxygen Induction Type: IV induction LMA: LMA with gastric port inserted LMA Size: 4.0 Number of attempts: 1 Placement Confirmation: positive ETCO2 and breath sounds checked- equal and bilateral Tube secured with: Tape Dental Injury: Teeth and Oropharynx as per pre-operative assessment

## 2018-08-05 NOTE — Transfer of Care (Signed)
Immediate Anesthesia Transfer of Care Note  Patient: Derrick Schultz  Procedure(s) Performed: CYSTOSCOPY WITH RETROGRADE PYELOGRAM, URETEROSCOPY AND STENT PLACEMENT (Left ) HOLMIUM LASER APPLICATION (Left )  Patient Location: PACU  Anesthesia Type:General  Level of Consciousness: awake, drowsy and patient cooperative  Airway & Oxygen Therapy: Patient Spontanous Breathing and Patient connected to face mask oxygen  Post-op Assessment: Report given to RN and Post -op Vital signs reviewed and stable  Post vital signs: Reviewed and stable  Last Vitals:  Vitals Value Taken Time  BP 122/71 08/05/2018  2:06 PM  Temp    Pulse 66 08/05/2018  2:09 PM  Resp 15 08/05/2018  2:09 PM  SpO2 100 % 08/05/2018  2:09 PM  Vitals shown include unvalidated device data.  Last Pain:  Vitals:   08/05/18 1053  TempSrc:   PainSc: 0-No pain         Complications: No apparent anesthesia complications

## 2018-08-06 ENCOUNTER — Encounter (HOSPITAL_COMMUNITY): Payer: Self-pay | Admitting: Urology

## 2018-08-06 NOTE — Anesthesia Postprocedure Evaluation (Signed)
Anesthesia Post Note  Patient: Derrick Schultz  Procedure(s) Performed: CYSTOSCOPY WITH RETROGRADE PYELOGRAM, URETEROSCOPY STONE EXTRACTION STENT PLACEMENT (Left )     Patient location during evaluation: PACU Anesthesia Type: General Level of consciousness: awake and alert Pain management: pain level controlled Vital Signs Assessment: post-procedure vital signs reviewed and stable Respiratory status: spontaneous breathing, nonlabored ventilation, respiratory function stable and patient connected to nasal cannula oxygen Cardiovascular status: blood pressure returned to baseline and stable Postop Assessment: no apparent nausea or vomiting Anesthetic complications: no    Last Vitals:  Vitals:   08/05/18 1445 08/05/18 1522  BP: 120/67 120/68  Pulse: 70 60  Resp: 14 15  Temp: 36.6 C 36.7 C  SpO2: 95% 100%    Last Pain:  Vitals:   08/05/18 1522  TempSrc:   PainSc: 1    Pain Goal:                   Catalina Gravel

## 2018-08-08 DIAGNOSIS — N201 Calculus of ureter: Secondary | ICD-10-CM | POA: Diagnosis not present

## 2018-08-13 DIAGNOSIS — N401 Enlarged prostate with lower urinary tract symptoms: Secondary | ICD-10-CM | POA: Diagnosis not present

## 2018-08-13 DIAGNOSIS — R351 Nocturia: Secondary | ICD-10-CM | POA: Diagnosis not present

## 2018-08-26 ENCOUNTER — Telehealth: Payer: Self-pay | Admitting: Family Medicine

## 2018-08-26 NOTE — Telephone Encounter (Signed)
Pt is free to schedule with Percell Miller at any time. I will defer to him regarding virtual vs in person. I gave the OK to change, he does not need anything further from me.

## 2018-08-26 NOTE — Telephone Encounter (Signed)
Patient's daughter called requesting a transfer of care appointment from Dr. Nani Ravens to Mackie Pai. Please advise.

## 2018-08-26 NOTE — Telephone Encounter (Signed)
Copied from [facility] [phone]. Topic: General - Inquiry >> Aug 26, 2018 10:10 AM Richardo Priest, NT wrote: Reason for CRM: Patient's daughter calling in stating she is in need of father having a TOC from [facility] to [employer], [place]. Patient's daughter is also requesting father come into the office in regards to having difficulty swallowing and being hoarse since surgery. Call back is [phone].    Will route to both providers/Edward Saguier is out of the office for the week.

## 2018-08-27 ENCOUNTER — Other Ambulatory Visit: Payer: Self-pay

## 2018-08-27 ENCOUNTER — Encounter: Payer: Self-pay | Admitting: Internal Medicine

## 2018-08-27 ENCOUNTER — Ambulatory Visit (INDEPENDENT_AMBULATORY_CARE_PROVIDER_SITE_OTHER): Payer: Medicare HMO | Admitting: Internal Medicine

## 2018-08-27 VITALS — BP 142/71 | HR 75 | Temp 98.5°F | Resp 16 | Ht 66.0 in | Wt 126.2 lb

## 2018-08-27 DIAGNOSIS — R634 Abnormal weight loss: Secondary | ICD-10-CM | POA: Diagnosis not present

## 2018-08-27 DIAGNOSIS — N4289 Other specified disorders of prostate: Secondary | ICD-10-CM | POA: Diagnosis not present

## 2018-08-27 DIAGNOSIS — K551 Chronic vascular disorders of intestine: Secondary | ICD-10-CM | POA: Diagnosis not present

## 2018-08-27 DIAGNOSIS — R1084 Generalized abdominal pain: Secondary | ICD-10-CM | POA: Diagnosis not present

## 2018-08-27 DIAGNOSIS — I714 Abdominal aortic aneurysm, without rupture, unspecified: Secondary | ICD-10-CM

## 2018-08-27 NOTE — Progress Notes (Signed)
Pre visit review using our clinic review tool, if applicable. No additional management support is needed unless otherwise documented below in the visit note. 

## 2018-08-27 NOTE — Progress Notes (Signed)
Subjective:    Patient ID: Derrick Schultz, male    DOB: [birth year], 85 y.o.   MRN: [id number]  DOS:  08/27/2018 Type of visit - description: Acute visit, here with his daughter  Main concern is abdominal pain, typically on the upper or mid abdomen, worse after eating, it may last hours, + nocturnal pain. She has a recent very complex medical history:  Admitted to the hospital 3/ 19/ 2020 During that admission, he was found to have left renal stone, stent was placed in the left ureter emergently. He also was found to have a renal mass, further work-up is planned. At the time the patient reports he was already having these upper and mid abdominal pain.  He was admitted again 08/05/2018, he had a left retrograde pyelogram, stone manipulation, left JJ stent exchange.  all  was done under general anesthesia.  Wt Readings from Last 3 Encounters:  08/27/18 126 lb 4 oz (57.3 kg)  08/05/18 138 lb 4.8 oz (62.7 kg)  07/30/18 138 lb 4.8 oz (62.7 kg)     Review of Systems No fever chills + Documented weight loss No chest pain no difficulty breathing He actually has no nausea or heartburn. P.o. is somewhat limited because since the second surgery 08/05/2018 he has some dysphagia to solids.  Also his voice is different. No odynophagia Stools are brown, 1 BM daily typically, no blood.   Past Medical History:  Diagnosis Date  . Elevated PSA    history of elevated; saw urology  . History of colonic polyps   . History of kidney stones   . History of nephrolithiasis    lithotripsy  . Hypertension    no meds  . Skin lesion    of right templs- Dr. Baltazar Najjar    Past Surgical History:  Procedure Laterality Date  . COLONOSCOPY     x 2 intial showed polyps  . CYSTOSCOPY WITH RETROGRADE PYELOGRAM, URETEROSCOPY AND STENT PLACEMENT Left 05/23/2018   Procedure: CYSTOSCOPY WITH RETROGRADE PYELOGRAM, URETEROSCOPY,  AND STENT PLACEMENT;  Surgeon: Cleon Gustin, MD;  Location: [facility];  Service:  Urology;  Laterality: Left;  . CYSTOSCOPY WITH RETROGRADE PYELOGRAM, URETEROSCOPY AND STENT PLACEMENT Left 08/05/2018   Procedure: CYSTOSCOPY WITH RETROGRADE PYELOGRAM, URETEROSCOPY STONE EXTRACTION STENT PLACEMENT;  Surgeon: Cleon Gustin, MD;  Location: [facility];  Service: Urology;  Laterality: Left;  30 MINS  . HERNIA REPAIR     right inguinal    Social History   Socioeconomic History  . Marital status: Widowed    Spouse name: Not on file  . Number of children: 3  . Years of education: Not on file  . Highest education level: Not on file  Occupational History    Employer: RETIRED  Social Needs  . Financial resource strain: Not on file  . Food insecurity    Worry: Not on file    Inability: Not on file  . Transportation needs    Medical: Not on file    Non-medical: Not on file  Tobacco Use  . Smoking status: Never Smoker  . Smokeless tobacco: Never Used  Substance and Sexual Activity  . Alcohol use: No  . Drug use: No  . Sexual activity: Not on file  Lifestyle  . Physical activity    Days per week: Not on file    Minutes per session: Not on file  . Stress: Not on file  Relationships  . Social connections    Talks on phone: Not on  file    Gets together: Not on file    Attends religious service: Not on file    Active member of club or organization: Not on file    Attends meetings of clubs or organizations: Not on file    Relationship status: Not on file  . Intimate partner violence    Fear of current or ex partner: Not on file    Emotionally abused: Not on file    Physically abused: Not on file    Forced sexual activity: Not on file  Other Topics Concern  . Not on file  Social History Narrative   Retired - worked as Games developer   Widowed since 2005   2 daughters, 1 son  (son lives nearby)   Never Smoked    Alcohol use-no       Allergies as of 08/27/2018   No Known Allergies     Medication List       Accurate as of August 27, 2018  4:16 PM. If you have  any questions, ask your nurse or doctor.        acetaminophen 500 MG tablet Commonly known as: TYLENOL Take 250-500 mg by mouth every 6 (six) hours as needed for mild pain.   Fish Oil 1000 MG Caps Take 1,000 mg by mouth daily.   oxyCODONE-acetaminophen 5-325 MG tablet Commonly known as: PERCOCET/ROXICET Take 1-2 tablets by mouth every 4 (four) hours as needed for moderate pain.   tamsulosin 0.4 MG Caps capsule Commonly known as: FLOMAX Take 1 capsule (0.4 mg total) by mouth daily after supper.           Objective:   Physical Exam BP (!) 142/71 (BP Location: Right Arm, Patient Position: Sitting, Cuff Size: Small)   Pulse 75   Temp 98.5 F (36.9 C) (Oral)   Resp 16   Ht 5\' 6"  (1.676 m)   Wt 126 lb 4 oz (57.3 kg)   SpO2 99%   BMI 20.38 kg/m  General:   Well developed, NAD, BMI noted.  HEENT:  Normocephalic . Face symmetric, atraumatic Lungs:  CTA B Normal respiratory effort, no intercostal retractions, no accessory muscle use. Heart: RRR,  no murmur.  no pretibial edema bilaterally  Abdomen:  Not distended, soft, non-tender. No rebound or rigidity.   Mid abdomen palpable, nontender pulsatile mass consistent with an aorta enlargement, + bruit. DRE:  Greenish, brown stools, Hemoccult negative.  No frank blood or mucus Prostate: Definitely enlarged on the left, not tender. Skin: Not pale. Not jaundice Neurologic:  alert & oriented X3.  Speech normal, gait appropriate for age and unassisted Psych--  Cognition and judgment appear intact.  Cooperative with normal attention span and concentration.  Behavior appropriate. No anxious or depressed appearing.     Assessment    85 year old gentleman, PMH includes elevated PSA, hypertension.  Most recent medical history includes a ureteral stone requiring surgery and a renal mass, pending further eval.   Abdominal pain: Abdominal pain, worse postprandial, for several months, worse lately. Interestingly, he had a  AAA on CT 2018 ; most recent CT was done 05/23/2018 and at that time abdominal aorta was normal in caliber.  Today however he has a palpable pulsatile mass and a bruit. He also reports dysphagia to solids after last surgery under general anesthesia. He is Hemoccult negative today. Plan: CMP, CBC, amylase, lipase. Abdominal aorta and mesenteric vasculature ultrasound: DX AAA and GI angina GI referral Renal mass: Further work-up planned by urology Asymmetric prostate: Check  a PSA, will notify results to PCP and urology.  Last PSA on records from 2011: 4.0  Weight loss: Probably related to abdominal pain (GI angina?), for completeness we will get a chest x-ray. Dysphagia, voice changes: Since the last surgery, probably related to ET tube.  Consider ENT referral after GI evaluation. RTC with  PCP 1 month   Today, I spent more than 45   min with the patient: >50% of the time counseling regards abdominal pain, I did extensive chart review including 2 recent admissions and CTs. The patient was somewhat a poor historian, was difficult to obtain a clear history at first. Multiple questions from the patient's daughter who is present during these visit answered to the best of my ability.

## 2018-08-27 NOTE — Telephone Encounter (Signed)
Ok to transfer. Next appointment can be next week. Virtual Monday, Tuesday or Wednesday.

## 2018-08-27 NOTE — Patient Instructions (Addendum)
We will call you and schedule blood work to be done within the next couple of days  Get a chest XR   We will also schedule a visit with Dr. Nani Ravens  I am planning to refer you to a gastroenterologist  We will also schedule an ultrasound of your aorta  Please call anytime if your symptoms get much worse acutely.

## 2018-08-28 NOTE — Telephone Encounter (Signed)
Pt was seen by Dr Larose Kells yesterday.

## 2018-08-30 ENCOUNTER — Other Ambulatory Visit: Payer: Self-pay

## 2018-08-30 ENCOUNTER — Telehealth (INDEPENDENT_AMBULATORY_CARE_PROVIDER_SITE_OTHER): Payer: Medicare HMO | Admitting: Gastroenterology

## 2018-08-30 ENCOUNTER — Encounter: Payer: Self-pay | Admitting: Gastroenterology

## 2018-08-30 VITALS — Ht 66.0 in | Wt 126.0 lb

## 2018-08-30 DIAGNOSIS — R932 Abnormal findings on diagnostic imaging of liver and biliary tract: Secondary | ICD-10-CM

## 2018-08-30 DIAGNOSIS — R634 Abnormal weight loss: Secondary | ICD-10-CM

## 2018-08-30 DIAGNOSIS — R1310 Dysphagia, unspecified: Secondary | ICD-10-CM | POA: Diagnosis not present

## 2018-08-30 DIAGNOSIS — R1084 Generalized abdominal pain: Secondary | ICD-10-CM | POA: Diagnosis not present

## 2018-08-30 DIAGNOSIS — R195 Other fecal abnormalities: Secondary | ICD-10-CM

## 2018-08-30 DIAGNOSIS — K5900 Constipation, unspecified: Secondary | ICD-10-CM

## 2018-08-30 NOTE — Progress Notes (Signed)
Chief Complaint: Abdominal pain, dysphagia   Referring Provider:     Colon Branch, MD    HPI:    Due to current restrictions/limitations of in-office visits due to the COVID-19 pandemic, this scheduled clinical appointment was converted to a telehealth virtual consultation using D[employer].  -Time: 21 minutes -The patient did consent to this virtual visit and is aware of possible charges through their insurance for this visit.  -Names of all parties present: Derrick Schultz (patient), patient's daughter, patient's son, Gerrit Heck, DO, [facility] (physician) -Patient location: Home -Physician location: Office  Derrick Schultz is a 85 y.o. male referred to the Gastroenterology Clinic for evaluation of abdominal pain.  Pain is been present for several months, worsening lately.  Tends to be postprandial.  Separately, he endorses solid food dysphagia, starting after his last surgery (general anesthesia). Points to anterior neck. No reflux sxs.   Recently admitted in 05/2018 with left renal stone, stent placed in the left ureter emergently and also found to have a renal mass.  Was having upper and mid abdominal pain at that time as well.  Readmitted 08/05/2018 with left retrograde pyelogram, stone manipulation, left JJ stent exchange.  Has a history of AAA, referred for abdominal aorta/mesenteric vasculature ultrasound for possible GI angina.  Describes generalized abdominal pain starting months ago. Now steady pain and worse with eating in the last few months, 30 minutes after eating. Improved with recent increase dietary fiber and stopping Oxycodone after renal stone surgery. Has stopped Flomax. No hematochezia but does report dark stools intermittently since his initial ureteral surgery.   CBC in 5/26 with normal H/H but MCV/RDW 79/20 from 82/18.5 previously.  Repeat CBC along with CMP, amylase, lipase ordered by PCM earlier this week-patient needs to complete.  CT in 05/2018 (left  renal stone as above) demonstrated 7 mm indeterminate hypervascular lesion in the right hepatic lobe (favor benign) and otherwise normal from a GI standpoint.  Endoscopic history: -Colonoscopy (04/2005): Normal  Past medical history, past surgical history, social history, family history, medications, and allergies reviewed in the chart and with patient.    Past Medical History:  Diagnosis Date  . Elevated PSA    history of elevated; saw urology  . History of colonic polyps   . History of kidney stones   . History of nephrolithiasis    lithotripsy  . Hypertension    no meds  . Skin lesion    of right templs- Dr. Baltazar Najjar     Past Surgical History:  Procedure Laterality Date  . COLONOSCOPY     x 2 intial showed polyps  . CYSTOSCOPY WITH RETROGRADE PYELOGRAM, URETEROSCOPY AND STENT PLACEMENT Left 05/23/2018   Procedure: CYSTOSCOPY WITH RETROGRADE PYELOGRAM, URETEROSCOPY,  AND STENT PLACEMENT;  Surgeon: Cleon Gustin, MD;  Location: [facility];  Service: Urology;  Laterality: Left;  . CYSTOSCOPY WITH RETROGRADE PYELOGRAM, URETEROSCOPY AND STENT PLACEMENT Left 08/05/2018   Procedure: CYSTOSCOPY WITH RETROGRADE PYELOGRAM, URETEROSCOPY STONE EXTRACTION STENT PLACEMENT;  Surgeon: Cleon Gustin, MD;  Location: [facility];  Service: Urology;  Laterality: Left;  30 MINS  . HERNIA REPAIR     right inguinal   Family History  Problem Relation Age of Onset  . Suicidality Father        father committed suicide whe pt was 65 y/o  . Alcohol abuse Father   . Angina Mother   . Breast cancer Other   .  Coronary artery disease Other        male 1st degree relative died of MI age 37  . Hypertension Other   . Cancer Maternal Aunt   . Colon cancer Neg Hx    Social History   Tobacco Use  . Smoking status: Never Smoker  . Smokeless tobacco: Never Used  Substance Use Topics  . Alcohol use: No  . Drug use: No   Current Outpatient Medications  Medication Sig Dispense Refill  . acetaminophen  (TYLENOL) 500 MG tablet Take 250-500 mg by mouth every 6 (six) hours as needed for mild pain.    . tamsulosin (FLOMAX) 0.4 MG CAPS capsule Take 1 capsule (0.4 mg total) by mouth daily after supper. (Patient not taking: Reported on 08/30/2018) 30 capsule 11   No current facility-administered medications for this visit.    No Known Allergies   Review of Systems: All systems reviewed and negative except where noted in HPI.     Physical Exam:    Complete physical exam not completed due to the nature of this telehealth communication.   Gen: Awake, alert, and oriented, and well communicative. HEENT: EOMI, non-icteric sclera, NCAT, MMM Neck: Normal movement of head and neck Pulm: No labored breathing, speaking in full sentences without conversational dyspnea Derm: No apparent lesions or bruising in visible field MS: Moves all visible extremities without noticeable abnormality Psych: Pleasant, cooperative, normal speech, thought processing seemingly intact   ASSESSMENT AND PLAN;   1) abdominal pain Discussed the broad DDX for postprandial pain.  Pain seems to have improved with improvement in bowel habits, so constipation predominant pain certainly a reasonable explanation in the setting of recent pain medications, which she has since stopped earlier this month. -We will follow-up pending abdominal ultrasound scheduled for next week - Discussed utility of EGD to evaluate for gastritis, PUD, etc. they prefer to hold off pending labs and rads and observe for clinical improvement  2) Microcytosis: -Recent labs notable for normal H/H but change in MCV/RDW. -Check iron panel -Already ordered for repeat CBC and c-Met by PCM, scheduled to be drawn next week  3) Dark stools: -Reports intermittent dark stools.  As above, discussed EGD to evaluate for UGI pathology, which she prefer to hold off on pending the above studies  4) Dysphagia: -Intermittent solid food dysphagia, pointing to anterior  neck, starting after intubation for surgery.  Query local soft tissue edema which should improve. -Offered EGD for diagnostic and potentially therapeutic intent.  As above, holding off on endoscopy for now -Continue chew food thoroughly and with plenty of fluids  5) Constipation: -Improved since increased dietary fiber (bran) and stopping pain medications at the beginning of the month  6) Abnormal imaging: - Recent CT notable for indeterminate 7 mm lesion in the right hepatic lobe, with radiologist favoring benign etiology - Can consider repeat imaging in 12 months to ensure size stability  7) Renal mass 8) Nephrolithiasis -Follows with Urology  9) Weight loss - Likely related to the underlying as above - Discussed the role of endoscopy at length as above.  Patient deferring for now   Lavena Bullion, DO, [facility]  08/30/2018, 3:04 PM   Colon Branch, MD

## 2018-08-30 NOTE — Patient Instructions (Signed)
If you are age 83 or older, your body mass index should be between 23-30. Your Body mass index is 20.34 kg/m. If this is out of the aforementioned range listed, please consider follow up with your Primary Care Provider.  If you are age 41 or younger, your body mass index should be between 19-25. Your Body mass index is 20.34 kg/m. If this is out of the aformentioned range listed, please consider follow up with your Primary Care Provider.   To help prevent the possible spread of infection to our patients, communities, and staff; we will be implementing the following measures:  As of now we are not allowing any visitors/family members to accompany you to any upcoming appointments with [facility]. If you have any concerns about this please contact our office to discuss prior to the appointment.   Your provider has requested that you go to the basement level for lab work at our [place] location ([address] [place] [address]) . Press "B" on the elevator. The lab is located at the first door on the left as you exit the elevator. You may go at whatever time is convienent for you. The current hours of operations are Monday- Friday 7:30am-4:30pm.  It was a pleasure to see you today!  Vito Cirigliano, D.O.

## 2018-09-01 ENCOUNTER — Telehealth: Payer: Self-pay | Admitting: Medical

## 2018-09-01 NOTE — Telephone Encounter (Signed)
I saw [place] of aorta/abdomen placed by Dr. Larose Kells. Also labs that were placed.Please remind pt to get imaging, lab studies and follow through with recommendations of GI MD.  He can schedule virtual visit with me next week since transferring care from Dr. Nani Ravens. But scheuled next week so can review imaging, lab studies and assess if improved following gi recommendations.

## 2018-09-02 ENCOUNTER — Ambulatory Visit (HOSPITAL_BASED_OUTPATIENT_CLINIC_OR_DEPARTMENT_OTHER)
Admission: RE | Admit: 2018-09-02 | Discharge: 2018-09-02 | Disposition: A | Discharge status: Home / Self Care | Payer: Medicare HMO | Source: Ambulatory Visit | Attending: Internal Medicine | Admitting: Internal Medicine

## 2018-09-02 ENCOUNTER — Other Ambulatory Visit: Payer: Self-pay

## 2018-09-02 ENCOUNTER — Other Ambulatory Visit (INDEPENDENT_AMBULATORY_CARE_PROVIDER_SITE_OTHER): Payer: Medicare HMO

## 2018-09-02 DIAGNOSIS — I714 Abdominal aortic aneurysm, without rupture: Secondary | ICD-10-CM | POA: Diagnosis not present

## 2018-09-02 DIAGNOSIS — R932 Abnormal findings on diagnostic imaging of liver and biliary tract: Secondary | ICD-10-CM | POA: Diagnosis not present

## 2018-09-02 DIAGNOSIS — R634 Abnormal weight loss: Secondary | ICD-10-CM | POA: Diagnosis present

## 2018-09-02 DIAGNOSIS — R1084 Generalized abdominal pain: Secondary | ICD-10-CM | POA: Diagnosis not present

## 2018-09-02 DIAGNOSIS — R1310 Dysphagia, unspecified: Secondary | ICD-10-CM | POA: Diagnosis not present

## 2018-09-02 DIAGNOSIS — R0602 Shortness of breath: Secondary | ICD-10-CM | POA: Diagnosis not present

## 2018-09-02 DIAGNOSIS — D509 Iron deficiency anemia, unspecified: Secondary | ICD-10-CM | POA: Diagnosis not present

## 2018-09-02 DIAGNOSIS — N4289 Other specified disorders of prostate: Secondary | ICD-10-CM | POA: Diagnosis not present

## 2018-09-02 DIAGNOSIS — R079 Chest pain, unspecified: Secondary | ICD-10-CM | POA: Diagnosis not present

## 2018-09-02 LAB — CBC WITH DIFFERENTIAL/PLATELET
Basophils Absolute: 0 10*3/uL (ref 0.0–0.1)
Basophils Relative: 0.5 % (ref 0.0–3.0)
Eosinophils Absolute: 0 10*3/uL (ref 0.0–0.7)
Eosinophils Relative: 0.7 % (ref 0.0–5.0)
HCT: 54.3 % — ABNORMAL HIGH (ref 39.0–52.0)
Hemoglobin: 16.9 g/dL (ref 13.0–17.0)
Lymphocytes Relative: 14.2 % (ref 12.0–46.0)
Lymphs Abs: 1 10*3/uL (ref 0.7–4.0)
MCHC: 31.1 g/dL (ref 30.0–36.0)
MCV: 74.1 fl — ABNORMAL LOW (ref 78.0–100.0)
Monocytes Absolute: 0.8 10*3/uL (ref 0.1–1.0)
Monocytes Relative: 11.8 % (ref 3.0–12.0)
Neutro Abs: 4.9 10*3/uL (ref 1.4–7.7)
Neutrophils Relative %: 72.8 % (ref 43.0–77.0)
Platelets: 201 10*3/uL (ref 150.0–400.0)
RBC: 7.31 Mil/uL — ABNORMAL HIGH (ref 4.22–5.81)
RDW: 21 % — ABNORMAL HIGH (ref 11.5–15.5)
WBC: 6.7 10*3/uL (ref 4.0–10.5)

## 2018-09-02 LAB — PSA: PSA: 5.39 ng/mL — ABNORMAL HIGH (ref 0.10–4.00)

## 2018-09-03 LAB — COMPREHENSIVE METABOLIC PANEL
ALT: 9 U/L (ref 0–53)
AST: 11 U/L (ref 0–37)
Albumin: 3.5 g/dL (ref 3.5–5.2)
Alkaline Phosphatase: 75 U/L (ref 39–117)
BUN: 19 mg/dL (ref 6–23)
CO2: 25 mEq/L (ref 19–32)
Calcium: 9.9 mg/dL (ref 8.4–10.5)
Chloride: 98 mEq/L (ref 96–112)
Creatinine, Ser: 1.01 mg/dL (ref 0.40–1.50)
GFR: 70.1 mL/min (ref >60.00)
Glucose, Bld: 64 mg/dL — ABNORMAL LOW (ref 70–99)
Potassium: 4.7 mEq/L (ref 3.5–5.1)
Sodium: 138 mEq/L (ref 135–145)
Total Bilirubin: 1 mg/dL (ref 0.2–1.2)
Total Protein: 7.3 g/dL (ref 6.0–8.3)

## 2018-09-03 LAB — LIPASE: Lipase: 13 U/L (ref 11.0–59.0)

## 2018-09-03 LAB — IRON,TIBC AND FERRITIN PANEL
%SAT: 7 % (calc) — ABNORMAL LOW (ref 20–48)
Ferritin: 207 ng/mL (ref 24–380)
Iron: 14 ug/dL — ABNORMAL LOW (ref 50–180)
TIBC: 209 mcg/dL (calc) — ABNORMAL LOW (ref 250–425)

## 2018-09-03 LAB — AMYLASE: Amylase: 23 U/L — ABNORMAL LOW (ref 27–131)

## 2018-09-04 ENCOUNTER — Other Ambulatory Visit: Payer: Self-pay | Admitting: Internal Medicine

## 2018-09-04 ENCOUNTER — Ambulatory Visit (HOSPITAL_COMMUNITY)
Admission: RE | Admit: 2018-09-04 | Discharge: 2018-09-04 | Disposition: A | Discharge status: Home / Self Care | Payer: Medicare HMO | Source: Ambulatory Visit | Attending: Cardiovascular Disease | Admitting: Cardiovascular Disease

## 2018-09-04 ENCOUNTER — Telehealth: Payer: Self-pay | Admitting: Medical

## 2018-09-04 ENCOUNTER — Other Ambulatory Visit: Payer: Self-pay

## 2018-09-04 DIAGNOSIS — R1084 Generalized abdominal pain: Secondary | ICD-10-CM | POA: Diagnosis not present

## 2018-09-04 NOTE — Telephone Encounter (Signed)
Stable small aortic aneurysm found on imaging recently and compared to prior imaging. Pt switching to me. You could get him scheduled for virtual visit with me.

## 2018-09-05 ENCOUNTER — Telehealth: Payer: Self-pay | Admitting: Internal Medicine

## 2018-09-05 NOTE — Telephone Encounter (Signed)
Ok. I'm fine with that.  Pt decided not to switch to me

## 2018-09-05 NOTE — Telephone Encounter (Signed)
Patient of Dr. Bryan Lemma; I was contacted today by Dr. Larose Kells. Patient has had recent imaging and there is concern of a renal mass and possibly renal cancer which is being evaluated. Dr. 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  He wanted to make Dr. Bryan Lemma aware and discuss next step.  Dr. Bryan Lemma is on vacation this week but will return next week. He can review these findings but I recommended to Dr. Larose Kells that he refer patient to vascular surgery to discuss options for improving blood flow through the celiac artery as certainly this could be contributing to his abdominal pain and weight loss. Dr. Larose Kells will pursue vascular surgery consult and also wait to hear back from Dr. Bryan Lemma should he have other thoughts

## 2018-09-05 NOTE — Telephone Encounter (Signed)
Pt's daughter states they will hold off on switching and seeing another provider  based on the results they just received. Pt's daughter states she spoke with Dr. Larose Kells today about results and referral was sent.

## 2018-09-05 NOTE — Addendum Note (Signed)
Addended byDamita Dunnings D on: 09/05/2018 01:31 PM   Modules accepted: Orders

## 2018-09-09 ENCOUNTER — Telehealth: Payer: Self-pay | Admitting: Gastroenterology

## 2018-09-09 ENCOUNTER — Telehealth: Payer: Self-pay | Admitting: Family Medicine

## 2018-09-09 NOTE — Telephone Encounter (Signed)
Copied from [place] [id number]. Topic: General - Other >> Sep 09, 2018  1:50 PM Sheran Luz wrote: Patient's daughter requesting call back from [place] to discuss referral to gastro- she states she has not heard anything from their office and is unsure what they need to do now.  See Referral notes dated 08/28/18 GI spoke with the daughter and she said she would call back to schedule. Called the family to inform//left message to call back.

## 2018-09-09 NOTE — Telephone Encounter (Signed)
Pt daughter called and request to speak with nurse. Father is having severe abd pain can not eat or sleep has lost 15lbs within the last 2 wks. Please advise

## 2018-09-09 NOTE — Telephone Encounter (Signed)
Daughter states she is returning someone call from the office.  Unable to reach anyone and the call dropped.  Please call her back when you can.

## 2018-09-09 NOTE — Telephone Encounter (Signed)
Called back / left message to call back.

## 2018-09-09 NOTE — Telephone Encounter (Signed)
Called and spoke with patient's daughter, Marliss Czar, verified DPR, and she informed RN that advice was no longer needed as the patient has an appt with vascular sx for an obstruction and that is why the patient is in this condition-Leigh stated she no longer needed assistance with information/advice;

## 2018-09-10 NOTE — Telephone Encounter (Signed)
Dr. Larose Kells, I received the message from Dr. Hilarie Fredrickson re: the US demonstrating the Celiac Artery stenosis and I completely agree with his recommendation for referral to Vascular Surgery. This could certainly contribute to his post prandial pain and weight loss, particularly if the weight loss is from fear of eating as a means to avoid pain. Great call on the Ultrasound!!

## 2018-09-10 NOTE — Telephone Encounter (Signed)
Called left message to call back 

## 2018-09-11 ENCOUNTER — Other Ambulatory Visit: Payer: Self-pay

## 2018-09-11 ENCOUNTER — Ambulatory Visit: Payer: Medicare HMO | Admitting: Vascular Surgery

## 2018-09-11 ENCOUNTER — Encounter: Payer: Self-pay | Admitting: Vascular Surgery

## 2018-09-11 ENCOUNTER — Telehealth: Payer: Self-pay | Admitting: Family Medicine

## 2018-09-11 ENCOUNTER — Telehealth: Payer: Self-pay | Admitting: Gastroenterology

## 2018-09-11 VITALS — BP 141/78 | HR 75 | Temp 98.4°F | Resp 14 | Ht 67.0 in | Wt 117.4 lb

## 2018-09-11 DIAGNOSIS — R1010 Upper abdominal pain, unspecified: Secondary | ICD-10-CM | POA: Diagnosis not present

## 2018-09-11 DIAGNOSIS — I714 Abdominal aortic aneurysm, without rupture, unspecified: Secondary | ICD-10-CM

## 2018-09-11 NOTE — Telephone Encounter (Signed)
Lynn from vascular and vein specialist called to schedule pt for EGD--as per previous OV, pending results of abd [place] before scheduling.  Please review and advise scheduling.

## 2018-09-11 NOTE — Telephone Encounter (Signed)
Copied from [place] [id number]. Topic: General - Other >> Sep 11, 2018 11:07 AM Keene Breath wrote: Reason for CRM: Patient's daughter called to discuss the patient's test results with the doctor.  CB# [phone] Pola Corn)   Spoke to the daughter and she said his recent iron results are back and were really low. He is experiencing sleepliness, weight loss, no appetite (weighed 117 today at vein and vascular). He was seen by Dr. Larose Kells. Also----What kind of test can be done for renal cancer?

## 2018-09-11 NOTE — Telephone Encounter (Signed)
This has been taken care of per the daughter Dominico Rod.

## 2018-09-11 NOTE — Progress Notes (Signed)
Referring Physician: Dr Larose Kells  Patient name: Derrick Schultz MRN: [id number] DOB: 09-06-1930 Sex: male  REASON FOR CONSULT: Abdominal pain with weight loss rule out mesenteric ischemia HPI: Derrick Schultz is a 85 y.o. male, with an 50-month history of abdominal pain and weight loss.  He started losing weight in October 2019.  He intermittently has abdominal pain.  Sometimes this is related to postprandial events but sometimes it is not.  It is usually on the left side.  He also has some mild dysphasia with solid foods since having an operation a few months ago.  He also has a component of anorexia in addition to the abdominal pain.  He states that most of the time he is just not hungry.  He overall has been very active but has slowed down over the last few months.  He recently had a CT scan of the abdomen which showed a left renal mass which is being evaluated by urology.  I spoke with his urologist by phone today and he did not believe that the abdominal pain was related to what most likely is a tumor.  The patient has also been recently seen by GI.  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  He was then referred for further evaluation to rule out mesenteric ischemia as a possible etiology for his abdominal pain and weight loss.  He does have a history of abdominal aortic aneurysm.  Past Medical History:  Diagnosis Date  . Elevated PSA    history of elevated; saw urology  . History of colonic polyps   . History of kidney stones   . History of nephrolithiasis    lithotripsy  . Hypertension    no meds  . Skin lesion    of right templs- Dr. Baltazar Najjar   Past Surgical History:  Procedure Laterality Date  . COLONOSCOPY     x 2 intial showed polyps  . CYSTOSCOPY WITH RETROGRADE PYELOGRAM, URETEROSCOPY AND STENT PLACEMENT Left 05/23/2018   Procedure: CYSTOSCOPY WITH RETROGRADE PYELOGRAM, URETEROSCOPY,  AND STENT PLACEMENT;  Surgeon: Cleon Gustin, MD;  Location: [facility];  Service: Urology;  Laterality: Left;  . CYSTOSCOPY WITH RETROGRADE PYELOGRAM, URETEROSCOPY AND STENT PLACEMENT Left 08/05/2018   Procedure: CYSTOSCOPY WITH RETROGRADE PYELOGRAM, URETEROSCOPY STONE EXTRACTION STENT PLACEMENT;  Surgeon: Cleon Gustin, MD;  Location: [facility];  Service: Urology;  Laterality: Left;  30 MINS  . HERNIA REPAIR     right inguinal    Family History  Problem Relation Age of Onset  . Suicidality Father        father committed suicide whe pt was 7 y/o  . Alcohol abuse Father   . Angina Mother   . Breast cancer Other   . Coronary artery disease Other        male 1st degree relative died of MI age 47  . Hypertension Other   . Cancer Maternal Aunt   . Colon cancer Neg Hx     SOCIAL HISTORY: Social History   Socioeconomic History  . Marital status: Widowed    Spouse name: Not on file  . Number of children: 3  . Years of education: Not on file  . Highest education level: Not on file  Occupational History    Employer: RETIRED  Social Needs  . Financial resource strain: Not on file  . Food insecurity    Worry: Not on file    Inability: Not on file  .  Transportation needs    Medical: Not on file    Non-medical: Not on file  Tobacco Use  . Smoking status: Never Smoker  . Smokeless tobacco: Never Used  Substance and Sexual Activity  . Alcohol use: No  . Drug use: No  . Sexual activity: Not on file  Lifestyle  . Physical activity    Days per week: Not on file    Minutes per session: Not on file  . Stress: Not on file  Relationships  . Social Herbalist on phone: Not on file    Gets together: Not on file    Attends religious service: Not on file    Active member of club or organization: Not on file    Attends meetings of clubs or organizations: Not on file    Relationship status: Not on file  . Intimate partner violence    Fear of current or ex partner: Not on file    Emotionally abused: Not on file    Physically abused: Not  on file    Forced sexual activity: Not on file  Other Topics Concern  . Not on file  Social History Narrative   Retired - worked as Games developer   Widowed since 2005   2 daughters, 1 son  (son lives nearby)   Never Smoked    Alcohol use-no     No Known Allergies  Current Outpatient Medications  Medication Sig Dispense Refill  . acetaminophen (TYLENOL) 500 MG tablet Take 250-500 mg by mouth every 6 (six) hours as needed for mild pain.    . tamsulosin (FLOMAX) 0.4 MG CAPS capsule Take 1 capsule (0.4 mg total) by mouth daily after supper. (Patient not taking: Reported on 08/30/2018) 30 capsule 11   No current facility-administered medications for this visit.     ROS:   General:  + weight loss, no fever, no chills  HEENT: No recent headaches, no nasal bleeding, no visual changes, no sore throat  Neurologic: No dizziness, blackouts, seizures. No recent symptoms of stroke or mini- stroke. No recent episodes of slurred speech, or temporary blindness.  Cardiac: No recent episodes of chest pain/pressure, no shortness of breath at rest.  No shortness of breath with exertion.  Denies history of atrial fibrillation or irregular heartbeat  Vascular: No history of rest pain in feet.  No history of claudication.  No history of non-healing ulcer, No history of DVT   Pulmonary: No home oxygen, no productive cough, no hemoptysis,  No asthma or wheezing  Musculoskeletal:  [ ]  Arthritis, [ ]  Low back pain,  [ ]  Joint pain  Hematologic:No history of hypercoagulable state.  No history of easy bleeding.  No history of anemia  Gastrointestinal: No hematochezia or melena,  No gastroesophageal reflux, + trouble swallowing  Urinary: [ ]  chronic Kidney disease, [ ]  on HD - [ ]  MWF or [ ]  TTHS, [ ]  Burning with urination, [ ]  Frequent urination, [ ]  Difficulty urinating;   Skin: No rashes  Psychological: No history of anxiety,  No history of depression   Physical Examination   Vitals:   09/11/18  1010  BP: (!) 141/78  Pulse: 75  Resp: 14  Temp: 98.4 F (36.9 C)  SpO2: 96%  Weight: 117 lb 6.4 oz (53.3 kg)  Height: 5\' 7"  (1.702 m)    General:  Alert and oriented, no acute distress, thin frail appearing HEENT: Normal Neck: No JVD Pulmonary: Clear bilaterally Cardiac: Regular Rate and Rhythm  Abdomen: Soft, non-tender, non-distended, no mass, no scars, palpable aortic pulsation Skin: No rash Extremity Pulses:  2+ radial, brachial, femoral, dorsalis pedis, posterior tibial pulses bilaterally Musculoskeletal: No deformity or edema  Neurologic: Upper and lower extremity motor 5/5 and symmetric  DATA:  I reviewed the images from the patient's recent CT scan from March of this year.  This does suggest narrowing of the proximal celiac artery.  However the superior mesenteric artery and inferior mesenteric arteries are both widely patent with no evidence of any stenosis.  He did have a dilated segment of supra renal aorta of about 4 cm.  The infrarenal abdominal aorta is normal diameter  I also reviewed the images from his recent abdominal ultrasound.  This showed narrowing of the celiac artery origin with velocities of 366.  Is consistently greater than 70% stenosis.  There was no narrowing of the superior mesenteric artery or inferior mesenteric artery.  ASSESSMENT: #1 suprarenal abdominal aortic aneurysm 4 cm diameter would consider repair only if this reaches 6 cm.  In light of the fact the patient is 85 years old most likely will not come to repair  #2 celiac artery stenosis.  Since his superior mesenteric and inferior mesenteric arteries are widely patent.  It would be unusual to have mesenteric ischemia from isolated one-vessel stenosis.  Usually this requires 2 or 3 vessel occlusion versus severe stenosis.  I discussed with the patient and his daughter today that I think this is low likelihood related to mesenteric ischemia.  However, if his GI work-up is concluded to be completely  negative we could certainly revisit whether to do an arteriogram and possible angioplasty or stenting of this artery.  However I believe due to the low likelihood of this and risk of the procedure he is best managed first with full GI work-up.  In addition it might be worthwhile to repeat his CT scan of the abdomen and pelvis to see if he has had extension or progression of the tumor in his left kidney.  Certainly this may explain his anorexia if his tumor has progressed over the last 4 months.  PLAN: The patient will follow-up with me after his GI evaluation for further discussions regarding whether or not he needs an intervention for his celiac artery stenosis.   Ruta Hinds, MD [facility] Office: [phone] Pager: [phone]

## 2018-09-11 NOTE — Telephone Encounter (Signed)
Patient's daughter calling back requesting to speak with CMA to discuss recent results, specifically iron levels.

## 2018-09-11 NOTE — Telephone Encounter (Signed)
Patient has been scheduled with Dr. Bryan Lemma on 10/10/2018 at 10:00am;

## 2018-09-11 NOTE — Telephone Encounter (Signed)
CEF wants f/u appt after upper endoscopy with [facility].  Pt will call us after endoscopy to schedule appt with CEF

## 2018-09-12 ENCOUNTER — Telehealth: Payer: Self-pay | Admitting: Gastroenterology

## 2018-09-12 NOTE — Telephone Encounter (Signed)
Patient son is now calling to check status on when to get a call back regarding his lab results. Patient would like paz to call back due to he is the PCP to send orders.  Call back Richard # [phone] Truman Hayward (sister) # [phone]

## 2018-09-12 NOTE — Telephone Encounter (Signed)
Dr. Artis Flock (vascular) office called requesting sooner appt for patient. Pt has not been able to eat anything. Pt has an appt with Dr. Bryan Lemma on 8/6 but Dr. Oneida Alar is requesting pt to be seen this week or next. Pls contact pt's daughter Marliss Czar with new appt at [phone] or Dr. Oneida Alar' office.

## 2018-09-12 NOTE — Telephone Encounter (Signed)
"  Derrick Schultz, your kidney function and liver tests are normal.The PSA or prostate number is slightly elevated, we are sharing these results with your urologist." are Dr. Ethel Rana comments for the labs. I would defer to the urology team for testing for renal cell carcinoma as that is more their specialty. Ty.

## 2018-09-13 ENCOUNTER — Other Ambulatory Visit: Payer: Self-pay

## 2018-09-13 DIAGNOSIS — R1084 Generalized abdominal pain: Secondary | ICD-10-CM

## 2018-09-13 DIAGNOSIS — K5900 Constipation, unspecified: Secondary | ICD-10-CM

## 2018-09-13 DIAGNOSIS — R634 Abnormal weight loss: Secondary | ICD-10-CM

## 2018-09-13 NOTE — Telephone Encounter (Signed)
Spoke to the daughter that no one has said to them he has cancer (no one has told them it is malignant), why then do an endoscopy? He is having U/S on the tumor at urology next week?  Why? If it is spreading then why put him through all this testing? They just need someone to be honest with them. He is losing weight has all the symptoms of just not doing well? She would like to know what was his iron levels in the past? OR Does she just need to call urology to ask this questions??? She has been told there to call PCP? No one seems to one to answer their questions. IS Continued Testing that necessary for him at this time???

## 2018-09-13 NOTE — Progress Notes (Signed)
Patient has been scheduled for an EGD on 09/18/2018 at [facility], arrival time 3:00 pm with procedure at 4:00 pm; patient has been sent instructions for procedure on MyChart; Patient's daughter, Marliss Czar, verified receipt of instructions on MyChart when RN called to inform patient of CT abd/pel that has been ordered by Dr. Bryan Lemma to be done prior to patient's EGD; CT abd/pel is scheduled to be done on 09/16/2018 at [facility] arrival at 8:00am for a scan at 10:00; patient is to be NPO after midnight;  The patient has a follow up appt with Dr.Cirigliano on 10/10/2018 at 10:00 am;   Leigh verbalized understanding of information and instructions; Marliss Czar was advised to call back to the office should questions/concerns arise;

## 2018-09-13 NOTE — Telephone Encounter (Signed)
Spoke w pt's daughter and son. I tried to answer their questions to the best of my ability, but stated there is still unknown variables that will hopefully be answered next week. They appreciated my effort and have no further questions that can be answered by me.

## 2018-09-13 NOTE — Telephone Encounter (Signed)
Pt's daughter returned your call

## 2018-09-13 NOTE — Telephone Encounter (Signed)
Spoke with patient's daughter, Derrick Schultz, at length this afternoon. Discussed recent iron deficiency without anemia noted on labs (but with microcytosis). Discussed conversation with the Vascular Surgeon regarding Celiac Artery stenosis with o/w patent IMA/SMA. He continues to have dysphagia along with abdominal pain, which tends to be postprandial also nocturnal.  She does think that the abdominal pain limits his p.o. intake.  But is also limited by his dysphagia and overall fear of eating from both entities.  Discussed the potential multifactorial nature of his anorexia and weight loss, to include vascular etiology, dysphagia, and noted renal mass.  Intermittent constipation, but this seems to be overall improved.  Otherwise, no LGI sxs. Plan for the following:  -Repeat CT to evaluate for change in renal mass, metastasis, etc. scheduled for 7/13 -Expedited EGD with possible esophageal dilation and gastric/duodenal biopsies next week -Has appointment with Urology on 7/17 -Will plan for expedited follow-up with me after the EGD and CT - Will call patient's daughter with CT results next week - Holding off on colonoscopy d/t decreased anticipation to handle bowel prep along with higher index of UGI pathology - All questions answered and she was appreciative for the phone call

## 2018-09-13 NOTE — Telephone Encounter (Signed)
Called and spoke with patient's daughter-Leigh-verified DPR-she is requesting to have information shared with her concerning the "mass in/on kidney" is it malignant or not and is there mets? Feels like patient is being sent to different doctors and needs the "blunt and factual info" in order to make the next decision in the healthcare scheme of things; the patient has been seen by vascular and is scheduled to have an US kidney to re-eval mass; Marliss Czar is wanting to know if her father has CANCER or NOT as she does not want to put him through tests/scans if they are not needed or if they need to be done-get them done since the patient is still in pain and is really not able to eat much/well;  Please call daughter and speak with her concerning the patient - [phone]

## 2018-09-16 ENCOUNTER — Other Ambulatory Visit: Payer: Self-pay

## 2018-09-16 ENCOUNTER — Ambulatory Visit (HOSPITAL_BASED_OUTPATIENT_CLINIC_OR_DEPARTMENT_OTHER)
Admission: RE | Admit: 2018-09-16 | Discharge: 2018-09-16 | Disposition: A | Discharge status: Home / Self Care | Payer: Medicare HMO | Source: Ambulatory Visit | Attending: Gastroenterology | Admitting: Gastroenterology

## 2018-09-16 DIAGNOSIS — R634 Abnormal weight loss: Secondary | ICD-10-CM

## 2018-09-16 DIAGNOSIS — K5900 Constipation, unspecified: Secondary | ICD-10-CM

## 2018-09-16 DIAGNOSIS — K5730 Diverticulosis of large intestine without perforation or abscess without bleeding: Secondary | ICD-10-CM | POA: Diagnosis not present

## 2018-09-16 DIAGNOSIS — R1084 Generalized abdominal pain: Secondary | ICD-10-CM | POA: Diagnosis not present

## 2018-09-16 MED ORDER — IOHEXOL 300 MG/ML  SOLN
100.0000 mL | Freq: Once | INTRAMUSCULAR | Status: AC | PRN
  Administered 2018-09-16: 100 mL via INTRAVENOUS

## 2018-09-17 ENCOUNTER — Telehealth: Payer: Self-pay | Admitting: Gastroenterology

## 2018-09-17 NOTE — Telephone Encounter (Signed)

## 2018-09-18 ENCOUNTER — Other Ambulatory Visit: Payer: Self-pay

## 2018-09-18 ENCOUNTER — Ambulatory Visit (AMBULATORY_SURGERY_CENTER): Payer: Medicare HMO | Admitting: Gastroenterology

## 2018-09-18 ENCOUNTER — Encounter: Payer: Self-pay | Admitting: Gastroenterology

## 2018-09-18 VITALS — BP 112/68 | HR 65 | Temp 98.3°F | Resp 26 | Ht 66.0 in | Wt 126.0 lb

## 2018-09-18 DIAGNOSIS — C642 Malignant neoplasm of left kidney, except renal pelvis: Secondary | ICD-10-CM

## 2018-09-18 DIAGNOSIS — K259 Gastric ulcer, unspecified as acute or chronic, without hemorrhage or perforation: Secondary | ICD-10-CM

## 2018-09-18 DIAGNOSIS — K2970 Gastritis, unspecified, without bleeding: Secondary | ICD-10-CM

## 2018-09-18 DIAGNOSIS — R634 Abnormal weight loss: Secondary | ICD-10-CM

## 2018-09-18 DIAGNOSIS — K2980 Duodenitis without bleeding: Secondary | ICD-10-CM | POA: Diagnosis not present

## 2018-09-18 DIAGNOSIS — R1310 Dysphagia, unspecified: Secondary | ICD-10-CM

## 2018-09-18 DIAGNOSIS — R109 Unspecified abdominal pain: Secondary | ICD-10-CM | POA: Diagnosis not present

## 2018-09-18 DIAGNOSIS — K5710 Diverticulosis of small intestine without perforation or abscess without bleeding: Secondary | ICD-10-CM

## 2018-09-18 DIAGNOSIS — R1084 Generalized abdominal pain: Secondary | ICD-10-CM

## 2018-09-18 DIAGNOSIS — K3189 Other diseases of stomach and duodenum: Secondary | ICD-10-CM

## 2018-09-18 DIAGNOSIS — Q396 Congenital diverticulum of esophagus: Secondary | ICD-10-CM

## 2018-09-18 MED ORDER — PANTOPRAZOLE SODIUM 40 MG PO TBEC: 40.0000 mg | DELAYED_RELEASE_TABLET | Freq: Every day | ORAL | 0 refills | Status: AC

## 2018-09-18 MED ORDER — SODIUM CHLORIDE 0.9 % IV SOLN: 500.0000 mL | Freq: Once | INTRAVENOUS | Status: DC

## 2018-09-18 NOTE — Patient Instructions (Signed)
YOU HAD AN ENDOSCOPIC PROCEDURE TODAY AT THE [facility]:   Refer to the procedure report that was given to you for any specific questions about what was found during the examination.  If the procedure report does not answer your questions, please call your gastroenterologist to clarify.  If you requested that your care partner not be given the details of your procedure findings, then the procedure report has been included in a sealed envelope for you to review at your convenience later.  YOU SHOULD EXPECT: Some feelings of bloating in the abdomen. Passage of more gas than usual.  Walking can help get rid of the air that was put into your GI tract during the procedure and reduce the bloating. If you had a lower endoscopy (such as a colonoscopy or flexible sigmoidoscopy) you may notice spotting of blood in your stool or on the toilet paper. If you underwent a bowel prep for your procedure, you may not have a normal bowel movement for a few days.  Please Note:  You might notice some irritation and congestion in your nose or some drainage.  This is from the oxygen used during your procedure.  There is no need for concern and it should clear up in a day or so.  SYMPTOMS TO REPORT IMMEDIATELY:   Following upper endoscopy (EGD)  Vomiting of blood or coffee ground material  New chest pain or pain under the shoulder blades  Painful or persistently difficult swallowing  New shortness of breath  Fever of 100F or higher  Black, tarry-looking stools  For urgent or emergent issues, a gastroenterologist can be reached at any hour by calling [phone].   DIET:  We do recommend a small meal at first, but then you may proceed to your regular diet.  Drink plenty of fluids but you should avoid alcoholic beverages for 24 hours.  ACTIVITY:  You should plan to take it easy for the rest of today and you should NOT DRIVE or use heavy machinery until tomorrow (because of the sedation medicines used  during the test).    FOLLOW UP: Our staff will call the number listed on your records 48-72 hours following your procedure to check on you and address any questions or concerns that you may have regarding the information given to you following your procedure. If we do not reach you, we will leave a message.  We will attempt to reach you two times.  During this call, we will ask if you have developed any symptoms of COVID 19. If you develop any symptoms (ie: fever, flu-like symptoms, shortness of breath, cough etc.) before then, please call [phone].  If you test positive for Covid 19 in the 2 weeks post procedure, please call and report this information to us.    If any biopsies were taken you will be contacted by phone or by letter within the next 1-3 weeks.  Please call us at [phone] if you have not heard about the biopsies in 3 weeks.    SIGNATURES/CONFIDENTIALITY: You and/or your care partner have signed paperwork which will be entered into your electronic medical record.  These signatures attest to the fact that that the information above on your After Visit Summary has been reviewed and is understood.  Full responsibility of the confidentiality of this discharge information lies with you and/or your care-partner. 

## 2018-09-18 NOTE — Op Note (Signed)
[facility] Patient Name: Derrick Schultz Procedure Date: 09/18/2018 3:42 PM MRN: [id number] Endoscopist: Gerrit Heck , MD Age: 85 Referring MD:  Date of Birth: [birth year] Gender: Male Account #: [id number] Procedure:                Upper GI endoscopy Indications:              Epigastric abdominal pain, Upper abdominal pain                            (post prandial), Dysphagia (improving), Weight loss Medicines:                Monitored Anesthesia Care Procedure:                Pre-Anesthesia Assessment:                           - Prior to the procedure, a History and Physical                            was performed, and patient medications and                            allergies were reviewed. The patient's tolerance of                            previous anesthesia was also reviewed. The risks                            and benefits of the procedure and the sedation                            options and risks were discussed with the patient.                            All questions were answered, and informed consent                            was obtained. Prior Anticoagulants: The patient has                            taken no previous anticoagulant or antiplatelet                            agents. ASA Grade Assessment: III - A patient with                            severe systemic disease. After reviewing the risks                            and benefits, the patient was deemed in                            satisfactory condition to undergo the procedure.  After obtaining informed consent, the endoscope was                            passed under direct vision. Throughout the                            procedure, the patient's blood pressure, pulse, and                            oxygen saturations were monitored continuously. The                            Endoscope was introduced through the mouth, and   advanced to the second part of duodenum. The upper                            GI endoscopy was accomplished without difficulty.                            The patient tolerated the procedure well. Scope In: Scope Out: Findings:                 3 small, non-bleeding diverticula with a small                            opening and no stigmata of recent bleeding was                            found in the middle third of the esophagus.                           The Z-line was regular and was found 38 cm from the                            incisors. The esophagus was otherwise normal                            appearing. No luminal narrowing, rings, or                            strictures noted.                           Scattered mild inflammation characterized by                            erythema was found in the gastric body, at the                            incisura and in the gastric antrum. No ulcers or                            erosions. Biopsies were taken with a cold forceps  for Helicobacter pylori testing. Estimated blood                            loss was minimal.                           There was a medium-sized subepithelial lesion noted                            in the second portion of the duodenum. This                            initially appeared to be a medium-sized lipoma,                            with positive pillow sign with a closed forceps.                            Took bite-on-bite biopsies with a cold forceps for                            histology, with immediate rush of a white, milky                            exudate from the lesion. The lesion fully                            decompressed after the biopsies, more consistent                            with a subepithelial cyst. Estimated blood loss was                            minimal.                           The mucosa was otherwise normal appearing in the                             duodenal bulb, in the first portion of the duodenum                            and in the second portion of the duodenum. Biopsies                            for histology were taken with a cold forceps for                            evaluation of celiac disease. Estimated blood loss                            was minimal. Complications:            No immediate complications. Estimated Blood Loss:  Estimated blood loss was minimal. 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                           - Z-line regular, 38 cm from the incisors.                           - Gastritis. Biopsied.                           - Duodenal subepithelial lesion, which seems more                            consistent with a cyst, as above. Biopsied.                           - Normal mucosa was found in the duodenal bulb, in                            the first portion of the duodenum and in the second                            portion of the duodenum. Biopsied.                           - Overall, have a low suspicion that the mild,                            non-ulcer gastritis noted on this study is the                            etiology of the abdominal pain, anorexia, and                            weight loss. Will trial a course of Protonix 40 mg                            PO BID x4 weeks and follow-up the pending pathology                            results. Recommendation:           - Patient has a contact number available for                            emergencies. The signs and symptoms of potential                            delayed complications were discussed with the                            patient. Return to normal activities tomorrow.  Written discharge instructions were provided to the                            patient.                           - Resume previous diet.                           -  Continue present medications.                           - Await pathology results.                           - Return to GI clinic at appointment to be                            scheduled.                           - Trial a course of Protonix (pantoprazole) 40 mg                            PO BID for 4 weeks to promote mucosal healing of                            the gastritis.                           - Follow-up in the Urology Clinic for suspected                            left Renal Cell Carcinoma.                           - Will place a referral to the Hematology-Oncology                            Clinic based on recent CT findings of multiple                            bilateral nodules in the lung bases and suspected                            left Renal Cell Carcinoma. Gerrit Heck, MD 09/18/2018 4:27:48 PM

## 2018-09-18 NOTE — Progress Notes (Signed)
To PACU, VSS. Report to Rn.tb 

## 2018-09-18 NOTE — Progress Notes (Signed)
Pt's states no medical or surgical changes since previsit or office visit. 

## 2018-09-18 NOTE — Progress Notes (Signed)
Called to room to assist during endoscopic procedure.  Patient ID and intended procedure confirmed with present staff. Received instructions for my participation in the procedure from the performing physician.  

## 2018-09-18 NOTE — Telephone Encounter (Signed)
Derrick Schultz and she answered no to all question on behalf of patient.

## 2018-09-18 NOTE — Progress Notes (Signed)
[name] took temp and [name] took vitals.

## 2018-09-19 ENCOUNTER — Encounter: Payer: Self-pay | Admitting: *Deleted

## 2018-09-19 NOTE — Progress Notes (Signed)
Reached out to Derrick Schultz to introduce myself as the office RN Navigator and explain our new patient process. Spoke with patient's daughter, Derrick Schultz as she is the main contact as requested by patient.  Reviewed the reason for  referral and scheduled new patient appointment along with labs. Provided address and directions to the office including call back phone number. Reviewed with patient any concerns they may have or any possible barriers to attending their appointment.   Informed Derrick Schultz about my role as a navigator and that I will meet with them prior to their New Patient appointment and more fully discuss what services I can provide. At this time patient has no further questions or needs.   Answered many questions relating to new potential diagnosis, probably timeline and expectations for first appointment. Patient has cognitive decline and can often become aggitated at MD appointments. She is approved to accompany patient with his new patient appointment. Note added to appointment.

## 2018-09-20 ENCOUNTER — Telehealth: Payer: Self-pay | Admitting: *Deleted

## 2018-09-20 DIAGNOSIS — C642 Malignant neoplasm of left kidney, except renal pelvis: Secondary | ICD-10-CM | POA: Diagnosis not present

## 2018-09-20 NOTE — Telephone Encounter (Signed)
  Follow up Call-  Call back number 09/18/2018  Post procedure Call Back phone  # [phone]  Permission to leave phone message Yes  Some recent data might be hidden     Patient questions:  Do you have a fever, pain , or abdominal swelling? No. Pain Score  0 *  Have you tolerated food without any problems? Yes.    Have you been able to return to your normal activities? Yes.    Do you have any questions about your discharge instructions: Diet   No. Medications  No. Follow up visit  No.  Do you have questions or concerns about your Care? No.  Actions: * If pain score is 4 or above: No action needed, pain <4.  1. Have you developed a fever since your procedure? NO  2.   Have you had an respiratory symptoms (SOB or cough) since your procedure? NO  3.   Have you tested positive for COVID 19 since your procedure NO  4.   Have you had any family members/close contacts diagnosed with the COVID 19 since your procedure?  NO   If yes to any of these questions please route to Joylene John, RN and Alphonsa Gin, RN.

## 2018-09-23 ENCOUNTER — Other Ambulatory Visit: Payer: Self-pay | Admitting: Hematology

## 2018-09-23 DIAGNOSIS — C649 Malignant neoplasm of unspecified kidney, except renal pelvis: Secondary | ICD-10-CM | POA: Insufficient documentation

## 2018-09-23 DIAGNOSIS — C642 Malignant neoplasm of left kidney, except renal pelvis: Secondary | ICD-10-CM

## 2018-09-23 NOTE — Progress Notes (Signed)
[place] NOTE  Patient Care Team: Shelda Pal, DO as PCP - General (Family Medicine) Arlis Porta, MD as Consulting Physician (Dermatology) Alyson Ingles Candee Furbish, MD as Consulting Physician (Urology) Cordelia Poche, RN as Oncology Nurse Navigator Tish Men, MD as Medical Oncologist (Hematology)  HEME/ONC OVERVIEW: 1. Suspected metastatic RCC -05/2018: CT AP for abdominal pain showed a large left renal mass, suspicious for RCC  -09/2018: CT AP showed the stable left renal mass 8.2 x 6.1 x 5.6cm, as well as new multiple lung nodules (largest RLL 2.0cm)   ASSESSMENT & PLAN:   Suspected metastatic RCC -I reviewed the patient's records in detail, including GI, vascular surgery, and urology clinic notes, lab studies, and imaging results -I also independently reviewed the radiology images from CT abdomen/pelvis, and agree with findings as documented -In summary, patient presented to the ER in 05/2018 for lower abdominal pain, and CT abdomen/pelvis showed a large left renal mass, suspicious for RCC.  He was admitted for pain control and urology placed a left ureteral stent. He then underwent left ureteral stone extraction in 08/2018. Due to the persistent abdominal pain, he was also referred to vascular surgery and GI for further evaluation of mesenteric ischemia and other GI pathology, respectively, but the work-up was unremarkable.  Repeat CT abdomen/pelvis in 09/2018 showed the stable left renal mass measuring approximately 8.2 x 6.1 x 5.6 cm, as well as new multiple lung nodules, the largest right lower lobe nodule measuring approximately 2.0 cm, concerning for metastatic disease.  Patient was referred to oncology for further evaluation and management. -I reviewed the imaging results in detail with the patient and his family, as well as NCCN guideline -I discussed the diagnostic work-up for RCC -As the treatment options are based on the type of RCC (clear cell  vs. non-clear cell), we will need tissue sampling first before being able to discuss specific treatment options  -Therefore, I have ordered CT chest and MRI brain w/ contrast to complete staging as well as CT-guided lung bx for tissue sampling and molecular studies  Cancer-related pain -Likely secondary to the large left renal mass -Pain not improving with OTC Tylenol -I have prescribed PRN tramadol  Constipation -I counseled the patient on the importance of maintaining soft BM's, especially given the increased risk of constipation with opioid medications -We discussed various bowel regimen, including Miralax, Senna, and other stool softeners   Protein malnutrition -Patient has lost at least 30 lbs since the beginning of the year, and his appetite is poor, likely partly due to the extrinsic compression of the stomach from the renal mass -I counseled the patient on the importance of maintaining adequate nutrition, including Boosts, Ensure, etc. -I have also placed consult for nutrition for further recommendations   Orders Placed This Encounter  Procedures  . CT CHEST W CONTRAST    Standing Status:   Future    Standing Expiration Date:   09/24/2019    Order Specific Question:   If indicated for the ordered procedure, I authorize the administration of contrast media per Radiology protocol    Answer:   Yes    Order Specific Question:   Preferred imaging location?    Answer:   [facility]    Order Specific Question:   Radiology Contrast Protocol - do NOT remove file path    Answer:   \\[email or web address].[email or web address]  . MR BRAIN W WO CONTRAST    Standing Status:   Future  Standing Expiration Date:   09/24/2019    Order Specific Question:   ** REASON FOR EXAM (FREE TEXT)    Answer:   Suspected metastatic RCC    Order Specific Question:   If indicated for the ordered procedure, I authorize the administration of contrast media per Radiology protocol    Answer:   Yes     Order Specific Question:   What is the patient's sedation requirement?    Answer:   No Sedation    Order Specific Question:   Does the patient have a pacemaker or implanted devices?    Answer:   No    Order Specific Question:   Use SRS Protocol?    Answer:   No    Order Specific Question:   Radiology Contrast Protocol - do NOT remove file path    Answer:   \\[email or web address]    Order Specific Question:   Preferred imaging location?    Answer:   [facility] (table limit-350 lbs)  . CT GUIDED NEEDLE PLACEMENT    Standing Status:   Future    Standing Expiration Date:   12/25/2019    Order Specific Question:   If indicated for the ordered procedure, I authorize the administration of contrast media per Radiology protocol    Answer:   Yes    Order Specific Question:   Reason for Exam (SYMPTOM  OR DIAGNOSIS REQUIRED)    Answer:   R lung nodule, need bx; suspected metastatic renal cell carcinoma    Order Specific Question:   Preferred imaging location?    Answer:   [facility]    Order Specific Question:   Radiology Contrast Protocol - do NOT remove file path    Answer:   \\[email or web address].pdf  . CBC with Differential ([place] Only)    Standing Status:   Future    Standing Expiration Date:   10/29/2019  . CMP ([place] only)    Standing Status:   Future    Standing Expiration Date:   10/29/2019  . Ambulatory referral to Nutrition and Diabetic E    Referral Priority:   Routine    Referral Type:   Consultation    Referral Reason:   Specialty Services Required    Number of Visits Requested:   1   All questions were answered. The patient knows to call the clinic with any problems, questions or concerns.  Return in 2 weeks for labs, imaging/pathology results, and clinic appt.   Tish Men, MD 09/24/2018 2:21 PM   CHIEF COMPLAINTS/PURPOSE OF CONSULTATION:  "I still have on and off flank pain"  HISTORY OF PRESENTING  ILLNESS:  AUTHER LYERLY 85 y.o. male is here because of suspected metastatic RCC.  Patient presented to [facility] ER in 05/2018 with several days of progressive worsening back pain, localized to the left flank area, moderate in intensity, nonradiating, without any associated diarrhea, nausea, vomiting, or dysuria.  He reported associated unintentional 15 pound weight loss over the past several weeks.  CT abdomen/pelvis in the ER showed a large left renal mass, suspicious for RCC.  Patient was admitted to the urology service, for which he underwent left ureteral stent placement.  He was readmitted in 08/2018 for abdominal pain and underwent left ureteral stone extraction.  He continued to have persistent abdominal pain, for which he was referred to vascular surgery and GI for evaluation of mesenteric ischemia and acute GI pathologies, respectively, but the work-up was unremarkable.  Repeat CT abdomen/pelvis  in 09/2018 showed overall stable left renal mass, but new pulmonary nodules, concerning for metastatic disease.  Patient was referred to oncology for further evaluation.  Patient reports that he still has intermittent left-sided flank pain, sudden onset, sometimes triggered by eating and lying flat, 1-2 times per day, each time lasting a few minutes, for which he has taken Tylenol PM at night without significant relief.  He has limited appetite, and has lost approximately 30 pounds since the beginning of the year.  He also has had some intermittent constipation, for which he is currently taking brans in his diet with some improvement.  He denies any fever, chill, night sweats, chest pain, dyspnea, nausea, vomiting, or abnormal bleeding/bruising.  I have reviewed his chart and materials related to his cancer extensively and collaborated history with the patient. Summary of oncologic history is as follows: Oncology History  Renal cell carcinoma ([place])  05/23/2018 Imaging   CT abdomen/pelvis w/  contrast: IMPRESSION: 1. Lobular enhancing mass replacing the upper and midpole of the LEFT kidney extending into the LEFT renal hilum consistent with renal neoplasm. Favor malignant neoplasm (renal cell carcinoma). Questionable extension into the LEFT renal vein with a small linear filling defect. No lymphadenopathy. Mass contained within the pararenal space. 2. Proximal ureteral obstruction on the LEFT by a calculus in the proximal LEFT ureter. Minimal excretion of contrast in LEFT kidney consistent with high-grade obstruction. Enhancement of the urothelium of the renal pelvis and proximal ureter could indicate superinfection of the collecting system. 3. Hypervascular lesion in the RIGHT hepatic lobe is indeterminate. Malignancy is not favored. 4. Recommend urology consultation.   09/16/2018 Imaging   CT abdomen/pelvis w/ contrast: IMPRESSION: 1. There is a redemonstrated large, heterogeneously hypoenhancing mass of the superior pole and midportion of the left kidney measuring approximately 8.2 x 6.1 x 5.6 cm, perhaps minimally enlarged compared to prior examination at which time it measured 8.1 x 5.2 x 5.4 cm when measured similar (series 2, image 32, series 4, image 43). This remains consistent with renal cell carcinoma and may additionally involve the left renal vein.   2. Multiple new nodules of the included bilateral lung bases, the largest measuring 2.0 cm in the right lower lobe (series 6, image 8), consistent with new pulmonary metastatic disease.   3.  Sigmoid diverticulosis.  Moderate burden of stool in the colon.   4.  Other chronic and incidental findings as detailed above.   09/23/2018 Initial Diagnosis   Renal cell carcinoma [facility])     MEDICAL HISTORY:  Past Medical History:  Diagnosis Date  . Elevated PSA    history of elevated; saw urology  . History of colonic polyps   . History of kidney stones   . History of nephrolithiasis    lithotripsy  .  Hypertension    no meds  . Skin lesion    of right templs- Dr. Baltazar Najjar    SURGICAL HISTORY: Past Surgical History:  Procedure Laterality Date  . COLONOSCOPY     x 2 intial showed polyps  . CYSTOSCOPY WITH RETROGRADE PYELOGRAM, URETEROSCOPY AND STENT PLACEMENT Left 05/23/2018   Procedure: CYSTOSCOPY WITH RETROGRADE PYELOGRAM, URETEROSCOPY,  AND STENT PLACEMENT;  Surgeon: Cleon Gustin, MD;  Location: [facility];  Service: Urology;  Laterality: Left;  . CYSTOSCOPY WITH RETROGRADE PYELOGRAM, URETEROSCOPY AND STENT PLACEMENT Left 08/05/2018   Procedure: CYSTOSCOPY WITH RETROGRADE PYELOGRAM, URETEROSCOPY STONE EXTRACTION STENT PLACEMENT;  Surgeon: Cleon Gustin, MD;  Location: [facility];  Service: Urology;  Laterality:  Left;  30 MINS  . HERNIA REPAIR     right inguinal    SOCIAL HISTORY: Social History   Socioeconomic History  . Marital status: Widowed    Spouse name: Not on file  . Number of children: 3  . Years of education: Not on file  . Highest education level: Not on file  Occupational History    Employer: RETIRED  Social Needs  . Financial resource strain: Not on file  . Food insecurity    Worry: Not on file    Inability: Not on file  . Transportation needs    Medical: Not on file    Non-medical: Not on file  Tobacco Use  . Smoking status: Never Smoker  . Smokeless tobacco: Never Used  Substance and Sexual Activity  . Alcohol use: No  . Drug use: No  . Sexual activity: Not on file  Lifestyle  . Physical activity    Days per week: Not on file    Minutes per session: Not on file  . Stress: Not on file  Relationships  . Social Herbalist on phone: Not on file    Gets together: Not on file    Attends religious service: Not on file    Active member of club or organization: Not on file    Attends meetings of clubs or organizations: Not on file    Relationship status: Not on file  . Intimate partner violence    Fear of current or ex partner: Not on file     Emotionally abused: Not on file    Physically abused: Not on file    Forced sexual activity: Not on file  Other Topics Concern  . Not on file  Social History Narrative   Retired - worked as Games developer   Widowed since 2005   2 daughters, 1 son  (son lives nearby)   Never Smoked    Alcohol use-no     FAMILY HISTORY: Family History  Problem Relation Age of Onset  . Suicidality Father        father committed suicide whe pt was 20 y/o  . Alcohol abuse Father   . Angina Mother   . Breast cancer Other   . Coronary artery disease Other        male 1st degree relative died of MI age 20  . Hypertension Other   . Cancer Maternal Aunt   . Colon cancer Neg Hx     ALLERGIES:  has No Known Allergies.  MEDICATIONS:  Current Outpatient Medications  Medication Sig Dispense Refill  . pantoprazole (PROTONIX) 40 MG tablet Take 1 tablet (40 mg total) by mouth daily. 30 tablet 0  . traMADol (ULTRAM) 50 MG tablet Take 1 tablet (50 mg total) by mouth every 8 (eight) hours as needed. 30 tablet 1   No current facility-administered medications for this visit.     REVIEW OF SYSTEMS:   Constitutional: ( - ) fevers, ( - )  chills , ( - ) night sweats Eyes: ( - ) blurriness of vision, ( - ) double vision, ( - ) watery eyes Ears, nose, mouth, throat, and face: ( - ) mucositis, ( - ) sore throat Respiratory: ( - ) cough, ( - ) dyspnea, ( - ) wheezes Cardiovascular: ( - ) palpitation, ( - ) chest discomfort, ( - ) lower extremity swelling Gastrointestinal:  ( - ) nausea, ( - ) heartburn, ( + ) change in bowel habits Skin: ( - )  abnormal skin rashes Lymphatics: ( - ) new lymphadenopathy, ( - ) easy bruising Neurological: ( - ) numbness, ( - ) tingling, ( - ) new weaknesses Behavioral/Psych: ( - ) mood change, ( - ) new changes  All other systems were reviewed with the patient and are negative.  PHYSICAL EXAMINATION: ECOG PERFORMANCE STATUS: 2 - Symptomatic, <50% confined to bed  Vitals:    09/24/18 1322  BP: 133/61  Pulse: 68  Resp: (!) 24  SpO2: 98%   Filed Weights   09/24/18 1322  Weight: 116 lb 12.8 oz (53 kg)    GENERAL: alert, no distress and comfortable, thin SKIN: skin color, texture, turgor are normal, no rashes or significant lesions EYES: conjunctiva are pink and non-injected, sclera clear OROPHARYNX: no exudate, no erythema; lips, buccal mucosa, and tongue normal  NECK: supple, non-tender LYMPH:  no palpable lymphadenopathy in the cervical LUNGS: clear to auscultation with normal breathing effort HEART: regular rate & rhythm, no murmurs, no lower extremity edema ABDOMEN: soft, non-tender, non-distended, normal bowel sounds Musculoskeletal: no cyanosis of digits and no clubbing  PSYCH: alert & oriented x 3, fluent speech  LABORATORY DATA:  I have reviewed the data as listed Lab Results  Component Value Date   WBC 7.5 09/24/2018   HGB 16.9 09/24/2018   HCT 57.3 (H) 09/24/2018   MCV 74.1 (L) 09/24/2018   PLT 196 09/24/2018   Lab Results  Component Value Date   NA 136 09/24/2018   K 4.4 09/24/2018   CL 100 09/24/2018   CO2 28 09/24/2018    RADIOGRAPHIC STUDIES: I have personally reviewed the radiological images as listed and agreed with the findings in the report. Dg Chest 2 View  Result Date: 09/02/2018 CLINICAL DATA:  2 week history of weight loss and lower chest pain. Increasing shortness of breath. EXAM: CHEST - 2 VIEW COMPARISON:  None. FINDINGS: The lungs are clear without focal pneumonia, edema, pneumothorax or pleural effusion. The cardiopericardial silhouette is within normal limits for size. The visualized bony structures of the thorax are intact. IMPRESSION: No active cardiopulmonary disease. Electronically Signed   By: Misty Stanley M.D.   On: 09/02/2018 16:41   Ct Abdomen Pelvis W Contrast  Result Date: 09/16/2018 CLINICAL DATA:  Constipation, weight loss, kidney mass, history of stones, hernia repair EXAM: CT ABDOMEN AND PELVIS WITH  CONTRAST TECHNIQUE: Multidetector CT imaging of the abdomen and pelvis was performed using the standard protocol following bolus administration of intravenous contrast. CONTRAST:  169mL OMNIPAQUE IOHEXOL 300 MG/ML SOLN, additional oral enteric contrast COMPARISON:  05/23/2018 FINDINGS: Lower chest: No acute abnormality. Multiple new nodules of the included bilateral lung bases, the largest measuring 2.0 cm in the right lower lobe (series 6, image 8). Hepatobiliary: Hepatic steatosis. Flash filling subcapsular hemangioma of the right lobe of the liver unchanged compared to prior. No gallstones, gallbladder wall thickening, or biliary dilatation. Pancreas: Unremarkable. No pancreatic ductal dilatation or surrounding inflammatory changes. Spleen: Normal in size without significant abnormality. Adrenals/Urinary Tract: Adrenal glands are unremarkable. There is a redemonstrated large, heterogeneously hypoenhancing mass of the superior pole and midportion of the left kidney measuring approximately 8.2 x 6.1 x 5.6 cm, perhaps minimally enlarged compared to prior examination at which time it measured 8.1 x 5.2 x 5.4 cm when measured similar (series 2, image 32, series 4, image 43). This may additionally involve the left renal vein. Bladder is unremarkable. Stomach/Bowel: Stomach is within normal limits. Appendix appears normal. No evidence of bowel wall thickening,  distention, or inflammatory changes. Sigmoid diverticulosis. Vascular/Lymphatic: Severe aortic atherosclerosis, with unchanged focal dissection and aneurysm near the diaphragmatic hiatus measuring up to 3.8 x 2.9 cm. No enlarged abdominal or pelvic lymph nodes. Reproductive: Prostatomegaly. Other: No abdominal wall hernia or abnormality. No abdominopelvic ascites. Musculoskeletal: Severe multilevel disc degenerative disease of the lumbar spine with anterolisthesis of L4 on L5 IMPRESSION: 1. There is a redemonstrated large, heterogeneously hypoenhancing mass of the  superior pole and midportion of the left kidney measuring approximately 8.2 x 6.1 x 5.6 cm, perhaps minimally enlarged compared to prior examination at which time it measured 8.1 x 5.2 x 5.4 cm when measured similar (series 2, image 32, series 4, image 43). This remains consistent with renal cell carcinoma and may additionally involve the left renal vein. 2. Multiple new nodules of the included bilateral lung bases, the largest measuring 2.0 cm in the right lower lobe (series 6, image 8), consistent with new pulmonary metastatic disease. 3.  Sigmoid diverticulosis.  Moderate burden of stool in the colon. 4.  Other chronic and incidental findings as detailed above. Electronically Signed   By: Eddie Candle M.D.   On: 09/16/2018 11:08   US Aorta  Result Date: 09/02/2018 CLINICAL DATA:  Abdominal aortic aneurysm EXAM: ULTRASOUND OF ABDOMINAL AORTA TECHNIQUE: Ultrasound examination of the abdominal aorta and proximal common iliac arteries was performed to evaluate for aneurysm. Additional color and Doppler images of the distal aorta were obtained to document patency. COMPARISON:  05/23/2018 CT FINDINGS: Abdominal aortic measurements as follows: Proximal:  4.0 cm Mid:  2.3 cm Distal:  1.8 cm Patent: Yes, Peak systolic velocity is 024 cm/s Right common iliac artery: 1.3 cm Left common iliac artery: 1.3 cm IMPRESSION: Focal proximal abdominal aortic aneurysm measuring maximally 4 cm, stable since prior CT Electronically Signed   By: Rolm Baptise M.D.   On: 09/02/2018 19:01   Vas [place] Mesenteric Duplex  Result Date: 09/05/2018 ABDOMINAL VISCERAL Indications: Patient presents with several months of generalized abdominal pain.              He has noticed that the past few weeks it seems to be more              post-prandial, a steady pain that comes about 30 minutes or more              after eating. The pain has slightly improved with increasing              dietary fiber and stopping his pain meds after renal stone  surgery. High Risk Factors: Hypertension, hyperlipidemia, no history of smoking. Comparison Study: Aorta US on 09/02/18 showed focal proximal abdominal aortic                   aneurysm mesuring 4 cm. Performing Technologist: Mariane Masters RVT  Examination Guidelines: A complete evaluation includes B-mode imaging, spectral Doppler, color Doppler, and power Doppler as needed of all accessible portions of each vessel. Bilateral testing is considered an integral part of a complete examination. Limited examinations for reoccurring indications may be performed as noted.  Duplex Findings: +----------------------+--------+--------+-----------+--------+ Mesenteric            PSV cm/sEDV cm/s  Plaque   Comments +----------------------+--------+--------+-----------+--------+ Celiac Artery Origin    366     100                       +----------------------+--------+--------+-----------+--------+ Celiac Artery Proximal  317  75                       +----------------------+--------+--------+-----------+--------+ SMA Origin               71      8                        +----------------------+--------+--------+-----------+--------+ SMA Proximal            223      45   hyperechoic         +----------------------+--------+--------+-----------+--------+ SMA Mid                 179      0                        +----------------------+--------+--------+-----------+--------+ SMA Distal              103      0                        +----------------------+--------+--------+-----------+--------+ CHA                     124      28                       +----------------------+--------+--------+-----------+--------+ Splenic                  79      24                       +----------------------+--------+--------+-----------+--------+ IMA                     182      0                        +----------------------+--------+--------+-----------+--------+ Mesenteric  Technologist observations: Diffuse atherosclerosis of the aorta, SMA and IMA without evidence of significant stenosis. Narrowing and elevated velocities noted at the celiac axis with post-stenotic dilatation, consistent with >70% stenosis. No decrease noted in celiac artery velocities with deep inspiration.  Summary: Largest Aortic Diameter: 3.7 cm in the proximal abdominal aorta.  Mesenteric: Normal Superior Mesenteric artery, Inferior Mesenteric artery, Splenic artery and Hepatic artery findings. 70 to 99% stenosis in the celiac artery.  *See table(s) above for measurements and observations.  Diagnosing physician: Kathlyn Sacramento MD  Electronically signed by Kathlyn Sacramento MD on 09/05/2018 at 7:36:44 AM.    Final     PATHOLOGY: I have reviewed the pathology reports as documented in the oncologist history.

## 2018-09-24 ENCOUNTER — Encounter: Payer: Self-pay | Admitting: *Deleted

## 2018-09-24 ENCOUNTER — Other Ambulatory Visit: Payer: Self-pay

## 2018-09-24 ENCOUNTER — Encounter: Payer: Self-pay | Admitting: Hematology

## 2018-09-24 ENCOUNTER — Inpatient Hospital Stay: Payer: Medicare HMO | Attending: Hematology

## 2018-09-24 ENCOUNTER — Encounter: Payer: Self-pay | Admitting: Gastroenterology

## 2018-09-24 ENCOUNTER — Telehealth: Payer: Self-pay | Admitting: Hematology

## 2018-09-24 ENCOUNTER — Inpatient Hospital Stay (HOSPITAL_BASED_OUTPATIENT_CLINIC_OR_DEPARTMENT_OTHER): Payer: Medicare HMO | Admitting: Hematology

## 2018-09-24 VITALS — BP 133/61 | HR 68 | Resp 24 | Ht 66.0 in | Wt 116.8 lb

## 2018-09-24 DIAGNOSIS — K5900 Constipation, unspecified: Secondary | ICD-10-CM | POA: Diagnosis not present

## 2018-09-24 DIAGNOSIS — C642 Malignant neoplasm of left kidney, except renal pelvis: Secondary | ICD-10-CM | POA: Diagnosis not present

## 2018-09-24 DIAGNOSIS — G893 Neoplasm related pain (acute) (chronic): Secondary | ICD-10-CM | POA: Diagnosis not present

## 2018-09-24 DIAGNOSIS — E46 Unspecified protein-calorie malnutrition: Secondary | ICD-10-CM

## 2018-09-24 DIAGNOSIS — R911 Solitary pulmonary nodule: Secondary | ICD-10-CM

## 2018-09-24 DIAGNOSIS — N2889 Other specified disorders of kidney and ureter: Secondary | ICD-10-CM

## 2018-09-24 LAB — CBC WITH DIFFERENTIAL (CANCER CENTER ONLY)
Abs Immature Granulocytes: 0.03 10*3/uL (ref 0.00–0.07)
Basophils Absolute: 0 10*3/uL (ref 0.0–0.1)
Basophils Relative: 0 %
Eosinophils Absolute: 0 10*3/uL (ref 0.0–0.5)
Eosinophils Relative: 0 %
HCT: 57.3 % — ABNORMAL HIGH (ref 39.0–52.0)
Hemoglobin: 16.9 g/dL (ref 13.0–17.0)
Immature Granulocytes: 0 %
Lymphocytes Relative: 13 %
Lymphs Abs: 1 10*3/uL (ref 0.7–4.0)
MCH: 21.9 pg — ABNORMAL LOW (ref 26.0–34.0)
MCHC: 29.5 g/dL — ABNORMAL LOW (ref 30.0–36.0)
MCV: 74.1 fL — ABNORMAL LOW (ref 80.0–100.0)
Monocytes Absolute: 0.8 10*3/uL (ref 0.1–1.0)
Monocytes Relative: 10 %
Neutro Abs: 5.6 10*3/uL (ref 1.7–7.7)
Neutrophils Relative %: 77 %
Platelet Count: 196 10*3/uL (ref 150–400)
RBC: 7.73 MIL/uL — ABNORMAL HIGH (ref 4.22–5.81)
RDW: 22.5 % — ABNORMAL HIGH (ref 11.5–15.5)
WBC Count: 7.5 10*3/uL (ref 4.0–10.5)
nRBC: 0 % (ref 0.0–0.2)

## 2018-09-24 LAB — CMP (CANCER CENTER ONLY)
ALT: 14 U/L (ref 0–44)
AST: 13 U/L — ABNORMAL LOW (ref 15–41)
Albumin: 3.4 g/dL — ABNORMAL LOW (ref 3.5–5.0)
Alkaline Phosphatase: 75 U/L (ref 38–126)
Anion gap: 8 (ref 5–15)
BUN: 22 mg/dL (ref 8–23)
CO2: 28 mmol/L (ref 22–32)
Calcium: 9.4 mg/dL (ref 8.9–10.3)
Chloride: 100 mmol/L (ref 98–111)
Creatinine: 1.01 mg/dL (ref 0.61–1.24)
GFR, Est AFR Am: >60 mL/min (ref >60)
GFR, Estimated: >60 mL/min (ref >60)
Glucose, Bld: 108 mg/dL — ABNORMAL HIGH (ref 70–99)
Potassium: 4.4 mmol/L (ref 3.5–5.1)
Sodium: 136 mmol/L (ref 135–145)
Total Bilirubin: 0.8 mg/dL (ref 0.3–1.2)
Total Protein: 7.2 g/dL (ref 6.5–8.1)

## 2018-09-24 MED ORDER — TRAMADOL HCL 50 MG PO TABS: 50.0000 mg | ORAL_TABLET | Freq: Three times a day (TID) | ORAL | 1 refills | Status: AC | PRN

## 2018-09-24 NOTE — Telephone Encounter (Signed)
Called and spoke with Leigh regarding appointments added .  She was ok with date/time per 7/21 los

## 2018-09-24 NOTE — Progress Notes (Signed)
Initial RN Navigator Patient Visit  Name: Derrick Schultz Date of Referral : 09/18/18 Diagnosis: Suspected Renal Call  Met with patient prior to their visit with MD. Hanley Seamen patient "Your Patient Navigator" handout which explains my role, areas in which I am able to help, and all the contact information for myself and the office. Also gave patient MD and Navigator business card. Reviewed with patient the general overview of expected course after initial diagnosis and time frame for all steps to be completed.  Patient completed visit with Dr. Maylon Peppers  Revisited with patient after MD visit. Patient will need  MRI brain - 09/30/18 @ 3pm CT Chest - 09/30/18 @ 1pm Lung Biopsy with IR - order placed. IR will schedule  [name] is aware of scan date, times and location.   Patient understands all follow up procedures and expectations. They have my number to reach out for any further clarification or additional needs. Will call patient in 5-7 days to see if any further needs have presented, or if patient has any further questions or needs.

## 2018-09-25 LAB — LACTATE DEHYDROGENASE: LDH: 181 U/L (ref 98–192)

## 2018-09-27 ENCOUNTER — Encounter: Payer: Self-pay | Admitting: *Deleted

## 2018-09-27 NOTE — Progress Notes (Signed)
Patient's biopsy not yet scheduled. Per IR they are waiting for the Chest CT to be completed to see if there is a better site to biospy. Updated the patient's daughter, Truman Hayward,  to account for the delay in scheduling. She understood and will wait to hear something next week.

## 2018-09-30 ENCOUNTER — Ambulatory Visit (HOSPITAL_COMMUNITY)
Admission: RE | Admit: 2018-09-30 | Discharge: 2018-09-30 | Disposition: A | Discharge status: Home / Self Care | Payer: Medicare HMO | Source: Ambulatory Visit | Attending: Hematology | Admitting: Hematology

## 2018-09-30 ENCOUNTER — Telehealth: Payer: Self-pay | Admitting: *Deleted

## 2018-09-30 ENCOUNTER — Encounter (HOSPITAL_COMMUNITY): Payer: Self-pay

## 2018-09-30 ENCOUNTER — Ambulatory Visit (HOSPITAL_COMMUNITY): Payer: Medicare HMO

## 2018-09-30 ENCOUNTER — Other Ambulatory Visit: Payer: Self-pay

## 2018-09-30 DIAGNOSIS — R918 Other nonspecific abnormal finding of lung field: Secondary | ICD-10-CM | POA: Insufficient documentation

## 2018-09-30 DIAGNOSIS — I712 Thoracic aortic aneurysm, without rupture: Secondary | ICD-10-CM | POA: Diagnosis not present

## 2018-09-30 DIAGNOSIS — I719 Aortic aneurysm of unspecified site, without rupture: Secondary | ICD-10-CM | POA: Insufficient documentation

## 2018-09-30 DIAGNOSIS — I700 Atherosclerosis of aorta: Secondary | ICD-10-CM | POA: Diagnosis not present

## 2018-09-30 DIAGNOSIS — N2889 Other specified disorders of kidney and ureter: Secondary | ICD-10-CM | POA: Insufficient documentation

## 2018-09-30 DIAGNOSIS — I2510 Atherosclerotic heart disease of native coronary artery without angina pectoris: Secondary | ICD-10-CM | POA: Insufficient documentation

## 2018-09-30 DIAGNOSIS — I679 Cerebrovascular disease, unspecified: Secondary | ICD-10-CM | POA: Diagnosis not present

## 2018-09-30 MED ORDER — GADOBUTROL 1 MMOL/ML IV SOLN
5.0000 mL | Freq: Once | INTRAVENOUS | Status: AC | PRN
  Administered 2018-09-30: 5 mL via INTRAVENOUS

## 2018-09-30 MED ORDER — SODIUM CHLORIDE (PF) 0.9 % IJ SOLN
INTRAMUSCULAR | Status: AC
  Filled 2018-09-30: qty 50

## 2018-09-30 MED ORDER — IOHEXOL 300 MG/ML  SOLN
75.0000 mL | Freq: Once | INTRAMUSCULAR | Status: AC | PRN
  Administered 2018-09-30: 75 mL via INTRAVENOUS

## 2018-09-30 NOTE — Telephone Encounter (Signed)
Call placed to patient's daughter Derrick Schultz to inform her that CT scan has been approved for today and that Dr. Maylon Peppers is aware that MRI's PA is pending.  Derrick Schultz is appreciative of call and is aware that CT has been approved for today.  Derrick Schultz is very appreciative of call and has no further questions or concerns at this time.

## 2018-10-02 ENCOUNTER — Telehealth: Payer: Self-pay | Admitting: Hematology

## 2018-10-02 NOTE — Telephone Encounter (Signed)
Called and spoke with Derrick Schultz regarding status of CT biopsy.  I explained to her that it was currently in the review process.  I also gave her Central Scheduling's phone number should she have any other questions.  I also gave her my name and number as well.  She voiced understanding of this information. Per 7/29 staff message

## 2018-10-03 ENCOUNTER — Inpatient Hospital Stay: Payer: Medicare HMO | Admitting: Nutrition

## 2018-10-03 ENCOUNTER — Ambulatory Visit: Payer: Medicare HMO | Admitting: Nutrition

## 2018-10-03 ENCOUNTER — Encounter: Payer: Self-pay | Admitting: Nutrition

## 2018-10-03 NOTE — Progress Notes (Signed)
Late entry  Patient's daughter called and left me a voice mail stating she did not receive notification of RD phone consult. She was not sure she wanted me to call today at 4:45. Reports her father probably won't listen. Stated she would return call and let me know. I have not heard from her so will wait to contact patient.

## 2018-10-03 NOTE — Progress Notes (Signed)
RD working remotely.  85 year old male diagnosed with Metastatic Renal cell cancer. He is a patient of Dr. Maylon Peppers.  PMH includes HTN, Nephrolithisasis, Kidney Stones.  Medications include protonix, Colace, Tylenol PM  Labs reviewed.  Height: 66 inches Weight: 116.8 pounds. UBW: 137 pounds Jan. 2020. BMI:18.85  Spoke with patient and daughter over the telephone.  Patient reports poor appetite and early satiety. He reports constipation which is improving with colace. Eats a good breakfast of Grits with bran, Hard boiled eggs, 2 toast with butter and jelly. He drinks a boost or ensure Max after breakfast. Around 2 pm he eats a bowl of soup and then drinks another boost or ensure max for dinner. He feels weak.  Nutrition Diagnosis: Unintended weight loss related to poor appetite as evidenced by 15% weight loss over 7 months.  Intervention: Educated patient to consume small frequent meals/snacks consisting of high calorie foods. Recommended change ONS to Boost Plus, Ensure Plus or Ensure Enlive for additional calories. Suggested drink 2 oz every 15 min over 1 hour. Try to increase ONS toTID Recommended consistent eating and drinking of fluids. Encouraged increased activity, esp after meals. Questions answered and teach back method used.  Monitoring, Evaluation, Goals: Patient will tolerate increased oral intake for improved energy and wt maintenance/gain.  Next Visit: Patient/daughter will call with questions.

## 2018-10-08 ENCOUNTER — Other Ambulatory Visit: Payer: Self-pay | Admitting: Hematology

## 2018-10-08 ENCOUNTER — Encounter: Payer: Self-pay | Admitting: *Deleted

## 2018-10-08 DIAGNOSIS — C642 Malignant neoplasm of left kidney, except renal pelvis: Secondary | ICD-10-CM

## 2018-10-08 NOTE — Progress Notes (Signed)
Patient's daughter, Truman Hayward calling the office with two concerns/questions.  Patient is scheduled for a CT biopsy on Monday. She would like to know what kind of sedation is done during this procedure. I called Cathy in IR and she stated that the patient would have conscious sedation for the procedure. Lee notified of this.  Patient is also becoming weaker and unable to perform ADLs at home. Family will be moving his bedroom downstairs, but would like to have a home health order placed to evaluate for any and all possible services that may be available to the patient. Dr Maylon Peppers placed this order. Referral routed to [facility]. Daughter informed of order placed.

## 2018-10-09 ENCOUNTER — Telehealth: Payer: Self-pay

## 2018-10-09 NOTE — Telephone Encounter (Signed)
Pola Corn (daughter) called and cancelled the appointment for 8/6. She said that he is going to the oncologist on next Friday and would like to talk to them first and if they feel like he needs an appointment then she will call back to reschedule

## 2018-10-09 NOTE — Telephone Encounter (Signed)
Covid-19 screening questions   Do you now or have you had a fever in the last 14 days?  Do you have any respiratory symptoms of shortness of breath or cough now or in the last 14 days?  Do you have any family members or close contacts with diagnosed or suspected Covid-19 in the past 14 days?  Have you been tested for Covid-19 and found to be positive?       Called and left patient vm to see about answering questions for ov on 8-6

## 2018-10-10 ENCOUNTER — Encounter: Payer: Self-pay | Admitting: *Deleted

## 2018-10-10 ENCOUNTER — Ambulatory Visit: Payer: Medicare HMO | Admitting: Gastroenterology

## 2018-10-10 ENCOUNTER — Other Ambulatory Visit: Payer: Medicare HMO

## 2018-10-10 ENCOUNTER — Ambulatory Visit: Payer: Medicare HMO | Admitting: Hematology

## 2018-10-10 ENCOUNTER — Other Ambulatory Visit: Payer: Self-pay | Admitting: Radiology

## 2018-10-10 NOTE — Progress Notes (Signed)
Received notification from [facility] that they are denying referral due to lack of staffing.   Reached out to [facility]. Spoke to [place]. They have staffing available but they are unsure whether or not they are in network with the patient's insurance. Referral sent to [phone] and they will notify me if they cannot fill request.  Garnette Scheuermann, patient's daughter to inform her of delay in process. I told her I would update her as I received updates.

## 2018-10-11 ENCOUNTER — Ambulatory Visit (HOSPITAL_COMMUNITY)
Admission: RE | Admit: 2018-10-11 | Discharge: 2018-10-11 | Disposition: A | Discharge status: Home / Self Care | Payer: Medicare HMO | Source: Ambulatory Visit | Attending: Hematology | Admitting: Hematology

## 2018-10-11 ENCOUNTER — Encounter: Payer: Self-pay | Admitting: *Deleted

## 2018-10-11 ENCOUNTER — Other Ambulatory Visit: Payer: Self-pay | Admitting: Radiology

## 2018-10-11 DIAGNOSIS — Z20828 Contact with and (suspected) exposure to other viral communicable diseases: Secondary | ICD-10-CM | POA: Diagnosis not present

## 2018-10-11 LAB — SARS CORONAVIRUS 2 (TAT 6-24 HRS): SARS Coronavirus 2: NEGATIVE

## 2018-10-11 NOTE — Progress Notes (Signed)
Received notification from [facility] that they are not in network and unable to fill the request. They suggested I call  [facility], contact Luanna Cole at [phone]. Spoke to another employee as Merrily Pew is out of the office, but they stated that they would be unable to fill the request due to staffing issues.  Reached out to [employer] at [phone]. They will now review the referral to see if they are in network and able to fill the request.   Followed up with [facility] and she states that through benefits investigation the patient is within network, and they are waiting on authorization. She believes this should be received by Monday at which time they will contact the patient.   Daughter updated to progress.

## 2018-10-14 ENCOUNTER — Ambulatory Visit (HOSPITAL_COMMUNITY)
Admission: RE | Admit: 2018-10-14 | Discharge: 2018-10-14 | Disposition: A | Discharge status: Home / Self Care | Payer: Medicare HMO | Source: Ambulatory Visit | Attending: Hematology | Admitting: Hematology

## 2018-10-14 ENCOUNTER — Other Ambulatory Visit: Payer: Self-pay

## 2018-10-14 ENCOUNTER — Encounter (HOSPITAL_COMMUNITY): Payer: Self-pay

## 2018-10-14 DIAGNOSIS — N2889 Other specified disorders of kidney and ureter: Secondary | ICD-10-CM | POA: Insufficient documentation

## 2018-10-14 DIAGNOSIS — C642 Malignant neoplasm of left kidney, except renal pelvis: Secondary | ICD-10-CM | POA: Diagnosis not present

## 2018-10-14 DIAGNOSIS — I10 Essential (primary) hypertension: Secondary | ICD-10-CM | POA: Insufficient documentation

## 2018-10-14 DIAGNOSIS — Z79899 Other long term (current) drug therapy: Secondary | ICD-10-CM | POA: Insufficient documentation

## 2018-10-14 DIAGNOSIS — R918 Other nonspecific abnormal finding of lung field: Secondary | ICD-10-CM | POA: Diagnosis not present

## 2018-10-14 DIAGNOSIS — N201 Calculus of ureter: Secondary | ICD-10-CM | POA: Diagnosis not present

## 2018-10-14 LAB — CBC
HCT: 61.3 % — ABNORMAL HIGH (ref 39.0–52.0)
Hemoglobin: 17.7 g/dL — ABNORMAL HIGH (ref 13.0–17.0)
MCH: 21.5 pg — ABNORMAL LOW (ref 26.0–34.0)
MCHC: 28.9 g/dL — ABNORMAL LOW (ref 30.0–36.0)
MCV: 74.4 fL — ABNORMAL LOW (ref 80.0–100.0)
Platelets: 188 10*3/uL (ref 150–400)
RBC: 8.24 MIL/uL — ABNORMAL HIGH (ref 4.22–5.81)
RDW: 23.9 % — ABNORMAL HIGH (ref 11.5–15.5)
WBC: 7.7 10*3/uL (ref 4.0–10.5)
nRBC: 0 % (ref 0.0–0.2)

## 2018-10-14 LAB — PROTIME-INR
INR: 1.3 — ABNORMAL HIGH (ref 0.8–1.2)
Prothrombin Time: 16.1 seconds — ABNORMAL HIGH (ref 11.4–15.2)

## 2018-10-14 LAB — APTT: aPTT: 32 seconds (ref 24–36)

## 2018-10-14 MED ORDER — MIDAZOLAM HCL 2 MG/2ML IJ SOLN
INTRAMUSCULAR | Status: AC | PRN
  Administered 2018-10-14 (×2): 0.5 mg via INTRAVENOUS

## 2018-10-14 MED ORDER — FENTANYL CITRATE (PF) 100 MCG/2ML IJ SOLN
INTRAMUSCULAR | Status: AC | PRN
  Administered 2018-10-14: 25 ug via INTRAVENOUS

## 2018-10-14 MED ORDER — SODIUM CHLORIDE 0.9 % IV SOLN: INTRAVENOUS | Status: DC

## 2018-10-14 MED ORDER — MIDAZOLAM HCL 2 MG/2ML IJ SOLN
INTRAMUSCULAR | Status: AC
  Filled 2018-10-14: qty 2

## 2018-10-14 MED ORDER — FENTANYL CITRATE (PF) 100 MCG/2ML IJ SOLN
INTRAMUSCULAR | Status: AC
  Filled 2018-10-14: qty 2

## 2018-10-14 MED ORDER — LIDOCAINE HCL 1 % IJ SOLN
INTRAMUSCULAR | Status: AC
  Filled 2018-10-14: qty 20

## 2018-10-14 MED ORDER — GELATIN ABSORBABLE 12-7 MM EX MISC
CUTANEOUS | Status: AC
  Filled 2018-10-14: qty 1

## 2018-10-14 NOTE — Procedures (Signed)
Interventional Radiology Procedure:   Indications: Left renal mass with metastasis  Procedure: Image guided left renal mass biopsy  Findings: Core biopsies from left renal mass using CT and [place].  2 cores obtained.  Left perinephric hematoma.  Complications: None     EBL: less than 50 ml  Plan: Strict bedrest 4 hours and BP control.  May need to keep overnight for observation if concern for bleeding.     Daemian Gahm R. Anselm Pancoast, MD  Pager: [phone]

## 2018-10-14 NOTE — Progress Notes (Addendum)
Referring Physician(s): Zhao,Yan  Supervising Physician: Markus Daft  Patient Status:  [facility] OP  Chief Complaint:  Renal mass biopsy today  Subjective:  Left renal mass biopsy in IR today Findings: Core biopsies from left renal mass using CT and [place].  2 cores obtained.  Left perinephric hematoma.  Pt has no complaints No pain Eating well Resting UOP clear yellow per RN    Allergies: Patient has no known allergies.  Medications: Prior to Admission medications   Medication Sig Start Date End Date Taking? Authorizing Provider  docusate sodium (COLACE) 100 MG capsule Take 100 mg by mouth 3 (three) times daily as needed for mild constipation.   Yes [provider]  Omega-3 Fatty Acids (FISH OIL) 1000 MG CAPS Take 1,000 mg by mouth daily.   Yes [provider]  diphenhydramine-acetaminophen (TYLENOL PM) 25-500 MG TABS tablet Take 1 tablet by mouth at bedtime.    [provider]  pantoprazole (PROTONIX) 40 MG tablet Take 1 tablet (40 mg total) by mouth daily. Patient not taking: Reported on 10/09/2018 09/18/18   Cirigliano, Vito V, DO  traMADol (ULTRAM) 50 MG tablet Take 1 tablet (50 mg total) by mouth every 8 (eight) hours as needed. Patient not taking: Reported on 10/09/2018 09/24/18   Tish Men, MD     Vital Signs: BP 132/69   Pulse 72   Temp 97.9 F (36.6 C) (Temporal)   Resp 16   Ht 5\' 6"  (1.676 m)   Wt 112 lb (50.8 kg)   SpO2 97%   BMI 18.08 kg/m   Physical Exam Vitals signs reviewed.  Genitourinary:    Comments: UOP 400 cc and clear yellow- per RN Skin:    General: Skin is warm and dry.     Comments: NT no bleeding at site -- clean and dry No hematoma VSS  Neurological:     Mental Status: He is alert.     Imaging: Ct Biopsy  Result Date: 10/14/2018 INDICATION: 85 year old with a large left renal mass and pulmonary nodules. Tissue diagnosis is needed. EXAM: CT-GUIDED LEFT RENAL MASS BIOPSY MEDICATIONS: Moderate sedation  ANESTHESIA/SEDATION: Moderate (conscious) sedation was employed during this procedure. A total of Versed 1.0 mg and Fentanyl 25 mcg was administered intravenously. Moderate Sedation Time: 32 minutes. The patient's level of consciousness and vital signs were monitored continuously by radiology nursing throughout the procedure under my direct supervision. FLUOROSCOPY TIME:  None COMPLICATIONS: None immediate. PROCEDURE: Informed written consent was obtained from the patient after a thorough discussion of the procedural risks, benefits and alternatives. All questions were addressed. A timeout was performed prior to the initiation of the procedure. Patient was placed prone and CT images of the abdomen were obtained. The large left renal mass was identified. Left renal mass was also identified with ultrasound. The left flank was prepped with chlorhexidine and sterile field was created. Skin and soft tissues were anesthetized with 1% lidocaine. 17 gauge coaxial needle was directed along the medial aspect of the lesion with CT guidance. Needle position confirmed within the lesion with CT and ultrasound. 2 core biopsies were obtained with an 18 gauge core device and placed in formalin. Significant bleeding through the coaxial needle when the inner stylet was removed. Gel-Foam slurry was injected through the 17 gauge needle as it was removed. Follow up CT images were obtained. Bandage placed over the puncture site. FINDINGS: Large irregular mass involving the mid and upper pole of the left kidney. The medial mid/upper pole region  was targeted with CT and ultrasound guidance. Needle position confirmed within the lesion. Significant bleeding through the 17 gauge needle when the inner stylet was removed. Two core biopsies were obtained. Additional core biopsies were not obtained due to the bleeding. Small perinephric hematoma developed immediately following placement of the 17 gauge needle. Perinephric hematoma do NOT  significantly enlarge during the procedure. IMPRESSION: CT-guided core biopsies of the left renal mass. Electronically Signed   By: Markus Daft M.D.   On: 10/14/2018 10:30    Labs:  CBC: Recent Labs    07/30/18 1043 09/02/18 1417 09/24/18 1259 10/14/18 0700  WBC 6.8 6.7 7.5 7.7  HGB 15.6 16.9 16.9 17.7*  HCT 54.2* 54.3 Repeated and verified X2.* 57.3* 61.3*  PLT 239 201.0 196 188    COAGS: Recent Labs    10/14/18 0700  INR 1.3*  APTT 32    BMP: Recent Labs    05/23/18 0906 05/24/18 0519 07/30/18 1043 09/02/18 1417 09/24/18 1259  NA 137 138 136 138 136  K 4.2 4.1 4.8 4.7 4.4  CL 106 109 102 98 100  CO2 23 23 27 25 28   GLUCOSE 149* 127* 86 64* 108*  BUN 20 23 16 19 22   CALCIUM 9.3 9.1 9.6 9.9 9.4  CREATININE 1.01 1.01 1.04 1.01 1.01  GFRNONAA >60 >60 >60  --  >60  GFRAA >60 >60 >60  --  >60    LIVER FUNCTION TESTS: Recent Labs    12/13/17 1302 05/23/18 0906 09/02/18 1417 09/24/18 1259  BILITOT 0.4 0.6 1.0 0.8  AST 10 14* 11 13*  ALT 8 10 9 14   ALKPHOS 65 66 75 75  PROT 7.2 7.9 7.3 7.2  ALBUMIN 3.5 3.3* 3.5 3.4*    Assessment and Plan:  Left renal mass biopsy + perinephric hematoma Post procedure observation without complication VSS May dc to home at MD specified time  Electronically Signed: Lavonia Drafts, PA-C 10/14/2018, 11:52 AM   I spent a total of 25 Minutes at the the patient's bedside AND on the patient's hospital floor or unit, greater than 50% of which was counseling/coordinating care for left renal mass biopsy

## 2018-10-14 NOTE — H&P (Signed)
Chief Complaint: Patient was seen in consultation today for left renal mass biopsy at the request of [place]  Referring Physician(s): Zhao,Yan  Supervising Physician: Markus Daft  Patient Status: [facility] - Out-pt  History of Present Illness: Derrick Schultz is a 85 y.o. male   Suspected renal cell cancer Presented to ED 05/2018- low abd pain Work up revealed renal mass and stone - was admitted and Uro placed ureteral stent and stone extraction  Follow up studies showing renal mass and lung nodules 7/13: IMPRESSION: 1. There is a redemonstrated large, heterogeneously hypoenhancing mass of the superior pole and midportion of the left kidney measuring approximately 8.2 x 6.1 x 5.6 cm, perhaps minimally enlarged compared to prior examination at which time it measured 8.1 x 5.2 x 5.4 cm when measured similar (series 2, image 32, series 4, image 43). This remains consistent with renal cell carcinoma and may additionally involve the left renal vein. 2. Multiple new nodules of the included bilateral lung bases, the largest measuring 2.0 cm in the right lower lobe (series 6, image 8), consistent with new pulmonary metastatic disease. 3.  Sigmoid diverticulosis.  Moderate burden of stool in the colon.   Other chronic and incidental findings as detailed above.  7/27: IMPRESSION: 1. Numerous bilateral pulmonary nodules, consistent with metastatic disease. 2. Lung parenchymal changes consistent with mild edema. 3. Stable appearance of aneurysmal dilatation and short segment dissection of the aorta at the level of the diaphragmatic hiatus. 4. Coronary artery disease. 5. Possible small osseous metastases within the vertebral bodies of T7 and T10. 6. Aortic Atherosclerosis (ICD10-I70.0). 7.  Aortic aneurysm NOS (ICD10-I71.9).  Scheduled for left renal mass biopsy per Dr Maylon Peppers  Past Medical History:  Diagnosis Date   Elevated PSA    history of elevated; saw urology   History of  colonic polyps    History of kidney stones    History of nephrolithiasis    lithotripsy   Hypertension    no meds   Skin lesion    of right templs- Dr. Baltazar Najjar    Past Surgical History:  Procedure Laterality Date   COLONOSCOPY     x 2 intial showed polyps   CYSTOSCOPY WITH RETROGRADE PYELOGRAM, URETEROSCOPY AND STENT PLACEMENT Left 05/23/2018   Procedure: CYSTOSCOPY WITH RETROGRADE PYELOGRAM, URETEROSCOPY,  AND STENT PLACEMENT;  Surgeon: Cleon Gustin, MD;  Location: [facility];  Service: Urology;  Laterality: Left;   CYSTOSCOPY WITH RETROGRADE PYELOGRAM, URETEROSCOPY AND STENT PLACEMENT Left 08/05/2018   Procedure: CYSTOSCOPY WITH RETROGRADE PYELOGRAM, URETEROSCOPY STONE EXTRACTION STENT PLACEMENT;  Surgeon: Cleon Gustin, MD;  Location: [facility];  Service: Urology;  Laterality: Left;  30 MINS   HERNIA REPAIR     right inguinal    Allergies: Patient has no known allergies.  Medications: Prior to Admission medications   Medication Sig Start Date End Date Taking? Authorizing Provider  docusate sodium (COLACE) 100 MG capsule Take 100 mg by mouth 3 (three) times daily as needed for mild constipation.   Yes [provider]  Omega-3 Fatty Acids (FISH OIL) 1000 MG CAPS Take 1,000 mg by mouth daily.   Yes [provider]  diphenhydramine-acetaminophen (TYLENOL PM) 25-500 MG TABS tablet Take 1 tablet by mouth at bedtime.    [provider]  pantoprazole (PROTONIX) 40 MG tablet Take 1 tablet (40 mg total) by mouth daily. Patient not taking: Reported on 10/09/2018 09/18/18   Cirigliano, Vito V, DO  traMADol (ULTRAM) 50 MG tablet Take 1 tablet (  50 mg total) by mouth every 8 (eight) hours as needed. Patient not taking: Reported on 10/09/2018 09/24/18   Tish Men, MD     Family History  Problem Relation Age of Onset   Suicidality Father        father committed suicide whe pt was 23 y/o   Alcohol abuse Father    Angina Mother    Breast cancer Other     Coronary artery disease Other        male 1st degree relative died of MI age 41   Hypertension Other    Cancer Maternal Aunt    Colon cancer Neg Hx     Social History   Socioeconomic History   Marital status: Widowed    Spouse name: Not on file   Number of children: 3   Years of education: Not on file   Highest education level: Not on file  Occupational History    Employer: RETIRED  Social Needs   Financial resource strain: Not on file   Food insecurity    Worry: Not on file    Inability: Not on file   Transportation needs    Medical: Not on file    Non-medical: Not on file  Tobacco Use   Smoking status: Never Smoker   Smokeless tobacco: Never Used  Substance and Sexual Activity   Alcohol use: No   Drug use: No   Sexual activity: Not on file  Lifestyle   Physical activity    Days per week: Not on file    Minutes per session: Not on file   Stress: Not on file  Relationships   Social connections    Talks on phone: Not on file    Gets together: Not on file    Attends religious service: Not on file    Active member of club or organization: Not on file    Attends meetings of clubs or organizations: Not on file    Relationship status: Not on file  Other Topics Concern   Not on file  Social History Narrative   Retired - worked as Games developer   Widowed since 2005   2 daughters, 1 son  (son lives nearby)   Never Smoked    Alcohol use-no     Review of Systems: A 12 point ROS discussed and pertinent positives are indicated in the HPI above.  All other systems are negative.  Review of Systems  Constitutional: Negative for activity change, fatigue, fever and unexpected weight change.  Respiratory: Negative for cough and shortness of breath.   Cardiovascular: Negative for chest pain.  Gastrointestinal: Negative for abdominal pain.  Musculoskeletal: Negative for back pain and gait problem.  Neurological: Negative for weakness.    Psychiatric/Behavioral: Negative for behavioral problems and confusion.    Vital Signs: BP 136/62    Pulse 72    Temp 97.6 F (36.4 C) (Oral)    Resp 16    Ht 5\' 6"  (1.676 m)    Wt 112 lb (50.8 kg)    SpO2 98%    BMI 18.08 kg/m   Physical Exam Vitals signs reviewed.  Cardiovascular:     Rate and Rhythm: Regular rhythm.     Heart sounds: Normal heart sounds.  Pulmonary:     Breath sounds: Normal breath sounds.  Abdominal:     General: Bowel sounds are normal.     Palpations: Abdomen is soft.  Musculoskeletal: Normal range of motion.  Skin:    General: Skin is warm  and dry.  Neurological:     Mental Status: He is alert and oriented to person, place, and time.  Psychiatric:        Mood and Affect: Mood normal.        Behavior: Behavior normal.        Thought Content: Thought content normal.        Judgment: Judgment normal.     Imaging: Ct Chest W Contrast  Result Date: 10/01/2018 CLINICAL DATA:  75 ml omni 300 Renal mass Pt.denies any chest complaints Bun 22 cr 1.0 09-24-2018^68mL OMNIPAQUE IOHEXOL 300 MG/ML SOLN EXAM: CT CHEST WITH CONTRAST TECHNIQUE: Multidetector CT imaging of the chest was performed during intravenous contrast administration. CONTRAST:  35mL OMNIPAQUE IOHEXOL 300 MG/ML  SOLN COMPARISON:  CT of the abdomen and pelvis on 09/16/2018 FINDINGS: Cardiovascular: Heart size is normal. No pericardial effusion. There are coronary artery calcifications. There is atherosclerotic calcification of the thoracic aorta. There is aneurysmal dilatation of the aorta at the level of the diaphragmatic hiatus, stable in appearance and measuring up to 3.8 centimeters. Focal dissection in this segment is unchanged. Mediastinum/Nodes: The visualized portion of the thyroid gland has a normal appearance. Lungs/Pleura: There are numerous masses throughout the lungs bilaterally. The largest represents a confluent mass in the posterior RIGHT LOWER lobe, measuring 2.6 x 2.4 x 1.6 centimeters on  image. Second nodule in the RIGHT LOWER lobe is 1.7 x 1.3 centimeters on image. Nodules measuring on the order of 1 centimeter are identified throughout the UPPER and LOWER lobes bilaterally. Rare calcified nodules are present. There is septal thickening and mild, diffuse dependent ground-glass opacity, consistent with mild edema. No pleural effusions. No consolidations. Upper Abdomen: Heterogeneous solid mass again identified within the LEFT kidney, partially imaged. RIGHT renal cyst is present. Gallbladder is present. Musculoskeletal: Moderate degenerative changes are identified in the thoracic spine. Small lucent lesion is identified in the posterior body of T7. Small lucent lesion is identified within the inferior vertebral body of T10. There is mild anterior wedge deformity of T10, consistent with chronic compression fracture. IMPRESSION: 1. Numerous bilateral pulmonary nodules, consistent with metastatic disease. 2. Lung parenchymal changes consistent with mild edema. 3. Stable appearance of aneurysmal dilatation and short segment dissection of the aorta at the level of the diaphragmatic hiatus. 4. Coronary artery disease. 5. Possible small osseous metastases within the vertebral bodies of T7 and T10. 6. Aortic Atherosclerosis (ICD10-I70.0). 7.  Aortic aneurysm NOS (ICD10-I71.9). Electronically Signed   By: Nolon Nations M.D.   On: 10/01/2018 08:30   Mr Jeri Cos EU Contrast  Result Date: 09/30/2018 CLINICAL DATA:  Suspected metastatic renal cell carcinoma. EXAM: MRI HEAD WITHOUT AND WITH CONTRAST TECHNIQUE: Multiplanar, multiecho pulse sequences of the brain and surrounding structures were obtained without and with intravenous contrast. CONTRAST:  6 mL Gadavist COMPARISON:  Head CT 06/05/2013 FINDINGS: An axial FLAIR sequence was inadvertently not performed. Brain: There is no evidence of acute infarct, intracranial hemorrhage, mass, midline shift, or extra-axial fluid collection. Patchy T2  hyperintensities in the cerebral white matter nonspecific but compatible with moderate chronic small vessel ischemic disease. There is moderate cerebral atrophy. No abnormal enhancement is identified. Vascular: Major intracranial vascular flow voids are preserved. Skull and upper cervical spine: Unremarkable bone marrow signal. Sinuses/Orbits: Unremarkable orbits. Paranasal sinuses and mastoid air cells are clear. Other: None. IMPRESSION: 1. No evidence of intracranial metastases. 2. Moderate chronic small vessel ischemic disease and cerebral atrophy. Electronically Signed   By: Seymour Bars.D.  On: 09/30/2018 21:49   Ct Abdomen Pelvis W Contrast  Result Date: 09/16/2018 CLINICAL DATA:  Constipation, weight loss, kidney mass, history of stones, hernia repair EXAM: CT ABDOMEN AND PELVIS WITH CONTRAST TECHNIQUE: Multidetector CT imaging of the abdomen and pelvis was performed using the standard protocol following bolus administration of intravenous contrast. CONTRAST:  154mL OMNIPAQUE IOHEXOL 300 MG/ML SOLN, additional oral enteric contrast COMPARISON:  05/23/2018 FINDINGS: Lower chest: No acute abnormality. Multiple new nodules of the included bilateral lung bases, the largest measuring 2.0 cm in the right lower lobe (series 6, image 8). Hepatobiliary: Hepatic steatosis. Flash filling subcapsular hemangioma of the right lobe of the liver unchanged compared to prior. No gallstones, gallbladder wall thickening, or biliary dilatation. Pancreas: Unremarkable. No pancreatic ductal dilatation or surrounding inflammatory changes. Spleen: Normal in size without significant abnormality. Adrenals/Urinary Tract: Adrenal glands are unremarkable. There is a redemonstrated large, heterogeneously hypoenhancing mass of the superior pole and midportion of the left kidney measuring approximately 8.2 x 6.1 x 5.6 cm, perhaps minimally enlarged compared to prior examination at which time it measured 8.1 x 5.2 x 5.4 cm when  measured similar (series 2, image 32, series 4, image 43). This may additionally involve the left renal vein. Bladder is unremarkable. Stomach/Bowel: Stomach is within normal limits. Appendix appears normal. No evidence of bowel wall thickening, distention, or inflammatory changes. Sigmoid diverticulosis. Vascular/Lymphatic: Severe aortic atherosclerosis, with unchanged focal dissection and aneurysm near the diaphragmatic hiatus measuring up to 3.8 x 2.9 cm. No enlarged abdominal or pelvic lymph nodes. Reproductive: Prostatomegaly. Other: No abdominal wall hernia or abnormality. No abdominopelvic ascites. Musculoskeletal: Severe multilevel disc degenerative disease of the lumbar spine with anterolisthesis of L4 on L5 IMPRESSION: 1. There is a redemonstrated large, heterogeneously hypoenhancing mass of the superior pole and midportion of the left kidney measuring approximately 8.2 x 6.1 x 5.6 cm, perhaps minimally enlarged compared to prior examination at which time it measured 8.1 x 5.2 x 5.4 cm when measured similar (series 2, image 32, series 4, image 43). This remains consistent with renal cell carcinoma and may additionally involve the left renal vein. 2. Multiple new nodules of the included bilateral lung bases, the largest measuring 2.0 cm in the right lower lobe (series 6, image 8), consistent with new pulmonary metastatic disease. 3.  Sigmoid diverticulosis.  Moderate burden of stool in the colon. 4.  Other chronic and incidental findings as detailed above. Electronically Signed   By: Eddie Candle M.D.   On: 09/16/2018 11:08    Labs:  CBC: Recent Labs    05/24/18 0519 07/30/18 1043 09/02/18 1417 09/24/18 1259  WBC 9.5 6.8 6.7 7.5  HGB 15.3 15.6 16.9 16.9  HCT 51.7 54.2* 54.3 Repeated and verified X2.* 57.3*  PLT 197 239 201.0 196    COAGS: No results for input(s): INR, APTT in the last 8760 hours.  BMP: Recent Labs    05/23/18 0906 05/24/18 0519 07/30/18 1043 09/02/18 1417  09/24/18 1259  NA 137 138 136 138 136  K 4.2 4.1 4.8 4.7 4.4  CL 106 109 102 98 100  CO2 23 23 27 25 28   GLUCOSE 149* 127* 86 64* 108*  BUN 20 23 16 19 22   CALCIUM 9.3 9.1 9.6 9.9 9.4  CREATININE 1.01 1.01 1.04 1.01 1.01  GFRNONAA >60 >60 >60  --  >60  GFRAA >60 >60 >60  --  >60    LIVER FUNCTION TESTS: Recent Labs    12/13/17 1302 05/23/18 0906 09/02/18  1417 09/24/18 1259  BILITOT 0.4 0.6 1.0 0.8  AST 10 14* 11 13*  ALT 8 10 9 14   ALKPHOS 65 66 75 75  PROT 7.2 7.9 7.3 7.2  ALBUMIN 3.5 3.3* 3.5 3.4*    TUMOR MARKERS: No results for input(s): AFPTM, CEA, CA199, CHROMGRNA in the last 8760 hours.  Assessment and Plan:  Lung nodules Left renal mass-- suspected Renal cell cancer Scheduled for left renal mass biopsy Risks and benefits of left renal mass was discussed with the patient and/or patient's family including, but not limited to bleeding, infection, damage to adjacent structures or low yield requiring additional tests.  All of the questions were answered and there is agreement to proceed. Consent signed and in chart.   Thank you for this interesting consult.  I greatly enjoyed meeting Derrick Schultz and look forward to participating in their care.  A copy of this report was sent to the requesting provider on this date.  Electronically Signed: Lavonia Drafts, PA-C 10/14/2018, 7:44 AM   I spent a total of  30 Minutes   in face to face in clinical consultation, greater than 50% of which was counseling/coordinating care for left renal mass bx

## 2018-10-14 NOTE — Progress Notes (Signed)
Jannifer Franklin PA in to see pt.  Band-Aid to back intact with no hematoma noted.

## 2018-10-14 NOTE — Discharge Instructions (Signed)
Percutaneous Kidney Biopsy, Care After This sheet gives you information about how to care for yourself after your procedure. Your health care provider may also give you more specific instructions. If you have problems or questions, contact your health care provider. What can I expect after the procedure? After the procedure, it is common to have:  Pain or soreness near the area where the needle went through your skin (biopsy site).  Bright pink or cloudy urine for 24 hours after the procedure. Follow these instructions at home: Activity  Return to your normal activities as told by your health care provider. Ask your health care provider what activities are safe for you.  Do not drive for 24 hours if you were given a medicine to help you relax (sedative).  Do not lift anything that is heavier than 10 lb (4.5 kg) until your health care provider tells you that it is safe.  Avoid activities that take a lot of effort (are strenuous) until your health care provider approves. Most people will have to wait 2 weeks before returning to activities such as exercise or sexual intercourse. General instructions   Take over-the-counter and prescription medicines only as told by your health care provider.  You may eat and drink after your procedure. Follow instructions from your health care provider about eating or drinking restrictions.  Check your biopsy site every day for signs of infection. Check for: ? More redness, swelling, or pain. ? More fluid or blood. ? Warmth. ? Pus or a bad smell.  Keep all follow-up visits as told by your health care provider. This is important. Contact a health care provider if:  You have more redness, swelling, or pain around your biopsy site.  You have more fluid or blood coming from your biopsy site.  Your biopsy site feels warm to the touch.  You have pus or a bad smell coming from your biopsy site.  You have blood in your urine more than 24 hours after  your procedure. Get help right away if:  You have dark red or brown urine.  You have a fever.  You are unable to urinate.  You feel burning when you urinate.  You feel faint.  You have severe pain in your abdomen or side. This information is not intended to replace advice given to you by your health care provider. Make sure you discuss any questions you have with your health care provider. Document Released: 10/23/2012 Document Revised: 02/02/2017 Document Reviewed: 12/03/2015 Elsevier Patient Education  [place]. Moderate Conscious Sedation, Adult, Care After These instructions provide you with information about caring for yourself after your procedure. Your health care provider may also give you more specific instructions. Your treatment has been planned according to current medical practices, but problems sometimes occur. Call your health care provider if you have any problems or questions after your procedure. What can I expect after the procedure? After your procedure, it is common:  To feel sleepy for several hours.  To feel clumsy and have poor balance for several hours.  To have poor judgment for several hours.  To vomit if you eat too soon. Follow these instructions at home: For at least 24 hours after the procedure:   Do not: ? Participate in activities where you could fall or become injured. ? Drive. ? Use heavy machinery. ? Drink alcohol. ? Take sleeping pills or medicines that cause drowsiness. ? Make important decisions or sign legal documents. ? Take care of children on your  own. °· Rest. °Eating and drinking °· Follow the diet recommended by your health care provider. °· If you vomit: °? Drink water, juice, or soup when you can drink without vomiting. °? Make sure you have little or no nausea before eating solid foods. °General instructions °· Have a responsible adult stay with you until you are awake and alert. °· Take over-the-counter and  prescription medicines only as told by your health care provider. °· If you smoke, do not smoke without supervision. °· Keep all follow-up visits as told by your health care provider. This is important. °Contact a health care provider if: °· You keep feeling nauseous or you keep vomiting. °· You feel light-headed. °· You develop a rash. °· You have a fever. °Get help right away if: °· You have trouble breathing. °This information is not intended to replace advice given to you by your health care provider. Make sure you discuss any questions you have with your health care provider. °Document Released: 12/11/2012 Document Revised: 02/02/2017 Document Reviewed: 06/12/2015 °Elsevier Patient Education © 2020 Elsevier Inc. ° °

## 2018-10-14 NOTE — Sedation Documentation (Signed)
Bedrest to start at 0920.

## 2018-10-14 NOTE — Progress Notes (Signed)
Discharge instructions reviewed with pt daughter voices understanding.  

## 2018-10-15 ENCOUNTER — Encounter: Payer: Self-pay | Admitting: *Deleted

## 2018-10-15 NOTE — Progress Notes (Signed)
Received a call from the patient's daughter. She was very upset over some miscommunication with his biopsy yesterday. Initially the plan was to biopsy a lung lesion, however the IR MD felt that it was too risky, and opted instead to biopsy a renal lesion. The daughter, who was present at the hospital during the procedure was not updated to this change, and didn't realize the procedure had changed until last evening when the patient was home. I was able to listen to the daughter and at the conclusion of the call she felt better about the incident.   A note was added to the permanent communication section of the patient chart, to ask that patient not sign consents for procedures without being in the presence of his son or daughter.   Also took this opportunity to ask the daughter if [facility] had reached out. She said that they did, but it was yesterday when patient was being discharged. She states they planned on calling back today.

## 2018-10-16 ENCOUNTER — Encounter: Payer: Self-pay | Admitting: *Deleted

## 2018-10-16 NOTE — Progress Notes (Signed)
[facility] sent on specimen [id number] per Dr Maylon Peppers

## 2018-10-18 ENCOUNTER — Encounter: Payer: Self-pay | Admitting: Hematology

## 2018-10-18 ENCOUNTER — Encounter: Payer: Self-pay | Admitting: *Deleted

## 2018-10-18 ENCOUNTER — Inpatient Hospital Stay: Payer: Medicare HMO | Attending: Hematology | Admitting: Hematology

## 2018-10-18 ENCOUNTER — Other Ambulatory Visit: Payer: Self-pay

## 2018-10-18 ENCOUNTER — Inpatient Hospital Stay: Payer: Medicare HMO

## 2018-10-18 VITALS — BP 154/51 | HR 45 | Temp 97.5°F | Resp 18 | Ht 66.0 in | Wt 118.4 lb

## 2018-10-18 DIAGNOSIS — C642 Malignant neoplasm of left kidney, except renal pelvis: Secondary | ICD-10-CM | POA: Diagnosis not present

## 2018-10-18 DIAGNOSIS — R1310 Dysphagia, unspecified: Secondary | ICD-10-CM | POA: Diagnosis not present

## 2018-10-18 DIAGNOSIS — E46 Unspecified protein-calorie malnutrition: Secondary | ICD-10-CM | POA: Diagnosis not present

## 2018-10-18 DIAGNOSIS — C7951 Secondary malignant neoplasm of bone: Secondary | ICD-10-CM | POA: Insufficient documentation

## 2018-10-18 DIAGNOSIS — C7800 Secondary malignant neoplasm of unspecified lung: Secondary | ICD-10-CM | POA: Insufficient documentation

## 2018-10-18 DIAGNOSIS — G893 Neoplasm related pain (acute) (chronic): Secondary | ICD-10-CM | POA: Diagnosis not present

## 2018-10-18 DIAGNOSIS — G4700 Insomnia, unspecified: Secondary | ICD-10-CM | POA: Diagnosis not present

## 2018-10-18 NOTE — Progress Notes (Signed)
[place] OFFICE PROGRESS NOTE  Patient Care Team: Shelda Pal, DO as PCP - General (Family Medicine) Arlis Porta, MD as Consulting Physician (Dermatology) Alyson Ingles Candee Furbish, MD as Consulting Physician (Urology) Cordelia Poche, RN as Oncology Nurse Navigator Tish Men, MD as Medical Oncologist (Hematology)  HEME/ONC OVERVIEW: 1. Stage IV metastatic clear cell RCC -05/2018: CT AP for abdominal pain showed a large left renal mass, suspicious for RCC  -09/2018: CT AP showed the stable left renal mass 8.2 x 6.1 x 5.6cm, as well as new multiple lung nodules (largest RLL 2.0cm)  -10/2018: numerous bilateral pulmonary nodules on CT chest w/ possible small bony met involving T7 and T10; left kidney bx showed clear cell RCC  ASSESSMENT & PLAN:   Stage IV metastatic clear cell RCC -I independently reviewed radiologic images of recent CT chest and MRI brain, and agree with findings documented -In summary, CT chest showed numerous bilateral pulmonary nodules, consistent with metastatic disease.  There were also changes in T7 and T10 vertebral bodies, suggesting early bony metastases.  MRI brain was negative for metastatic disease.  He underwent biopsy of the left kidney, which showed clear cell RCC. -I reviewed the imaging and pathology results in detail with the patient and his daughter -I also discussed with him the NCCN guidelines detail -In light of Stage IV RCC, his treatment options include TKI, immunotherapy + TKI and combination immunotherapy -Taking into account his performance status and other medical comorbidities, I recommended considering a combination of immunotherapy and TKI -We discussed some of the risks, benefits, side-effects of pembrolizumab with axitinib -Some of the short term side-effects included, though not limited to, including fatigue, weight loss, dyspnea, cough, musculoskeletal pain, life threatening infections, risk of allergic reactions,  need for transfusions of blood products, nausea, vomiting, change in bowel habits, loss of hair, admission to hospital for various reasons, and risks of death. Clinically significant immune-related adverse events for patients receiving immunotherapy included pneumonitis, hepatitis, colitis, and thyroid disease. -Long term side-effects are also discussed including risks of permanent damage to nerve function, hearing loss, chronic fatigue, kidney damage with possibility needing hemodialysis, and rare secondary malignancy including bone marrow disorders. -The patient is aware that the response rates discussed earlier is not guaranteed.   -After extensive discussion, patient and his daughter expressed understanding, but would like to discuss this further with his family before making a decision -If he chooses to proceed with treatment, he will also require port to facilitate treatment administration -We will await the patient's decision regarding treatment -If he declines treatment, it may be helpful to refer him to palliative care to establish rapport; as RCC may be relatively slow growing, he may not be in imminent need of hospice care, given the relatively few symptoms he is experiencing regarding his metastatic RCC   Cancer-related pain -Likely secondary to the large left RCC  -Continue PRN tramadol for now    Dysphagia -Etiology unclear; recent EGD unremarkable -No definite symptoms to suggest aspiration -I have ordered swallow function study to further assess the dysphagia   Protein malnutrition -Patient is currently drinking ~2-3 Boost per day -I encouraged patient to increase his nutritional supplements as tolerated -Nutrition consult has been placed but not yet scheduled; we will follow up on the status of his  Insomnia -Patient has been taking Tylenol PM for sleep without relief -I encouraged the patient to avoid Benadryl-based sleep aid due to the increase the risk of adverse reactions  in  the elderly population -I recommended them to try OTC melatonin and also counseled him on importance of sleep hygiene  Orders Placed This Encounter  Procedures  . DG SWALLOW FUNC SPEECH PATH    Standing Status:   Future    Standing Expiration Date:   12/18/2019    Order Specific Question:   Reason for Exam (SYMPTOM  OR DIAGNOSIS REQUIRED)    Answer:   Dysphagia    Order Specific Question:   Where should this test be performed?    Answer:   [facility]   All questions were answered. The patient knows to call the clinic with any problems, questions or concerns. No barriers to learning was detected.  A total of more than 40 minutes were spent face-to-face with the patient during this encounter and over half of that time was spent on counseling and coordination of care as outlined above.   Return to be determined, pending the patient and his family's decision.  Tish Men, MD 10/18/2018 3:49 PM (83)  CHIEF COMPLAINT: "I just can't sleep"  INTERVAL HISTORY: Mr. Fortson returns to clinic for follow-up of metastatic RCC.  Patient recently underwent CT-guided left kidney biopsy and tolerated well without any complications.  His appetite is limited to failure, and he is drinking 2 Boost per day, in addition to eating a reasonable breakfast and lunch.  He feels that he is not eating as much as he used to due to a sensation of food sometimes getting stuck in the throat, but he denies any coughing or other symptoms of aspiration with eating.  He has been taking Tylenol PM for sleep, but it does not seem to help him go to sleep or stay asleep.  He lives by himself and is fairly independent with ADLs.  He has occasional left flank/rib discomfort, but it usually resolves after a few seconds to short minutes, and he has not had to take any tramadol for pain.  He denies any other complaint today.  SUMMARY OF ONCOLOGIC HISTORY: Oncology History  Renal cell carcinoma ([place])  05/23/2018 Imaging   CT  abdomen/pelvis w/ contrast: IMPRESSION: 1. Lobular enhancing mass replacing the upper and midpole of the LEFT kidney extending into the LEFT renal hilum consistent with renal neoplasm. Favor malignant neoplasm (renal cell carcinoma). Questionable extension into the LEFT renal vein with a small linear filling defect. No lymphadenopathy. Mass contained within the pararenal space. 2. Proximal ureteral obstruction on the LEFT by a calculus in the proximal LEFT ureter. Minimal excretion of contrast in LEFT kidney consistent with high-grade obstruction. Enhancement of the urothelium of the renal pelvis and proximal ureter could indicate superinfection of the collecting system. 3. Hypervascular lesion in the RIGHT hepatic lobe is indeterminate. Malignancy is not favored. 4. Recommend urology consultation.   09/16/2018 Imaging   CT abdomen/pelvis w/ contrast: IMPRESSION: 1. There is a redemonstrated large, heterogeneously hypoenhancing mass of the superior pole and midportion of the left kidney measuring approximately 8.2 x 6.1 x 5.6 cm, perhaps minimally enlarged compared to prior examination at which time it measured 8.1 x 5.2 x 5.4 cm when measured similar (series 2, image 32, series 4, image 43). This remains consistent with renal cell carcinoma and may additionally involve the left renal vein.   2. Multiple new nodules of the included bilateral lung bases, the largest measuring 2.0 cm in the right lower lobe (series 6, image 8), consistent with new pulmonary metastatic disease.   3.  Sigmoid diverticulosis.  Moderate burden of stool  in the colon.   4.  Other chronic and incidental findings as detailed above.   09/30/2018 Imaging   MRI brain: IMPRESSION: 1. No evidence of intracranial metastases. 2. Moderate chronic    09/30/2018 Imaging   CT chest: IMPRESSION: 1. Numerous bilateral pulmonary nodules, consistent with metastatic disease. 2. Lung parenchymal changes consistent  with mild edema. 3. Stable appearance of aneurysmal dilatation and short segment dissection of the aorta at the level of the diaphragmatic hiatus. 4. Coronary artery disease. 5. Possible small osseous metastases within the vertebral bodies of T7 and T10. 6. Aortic Atherosclerosis (ICD10-I70.0). 7.  Aortic aneurysm NOS (ICD10-I71.9).   10/14/2018 Pathology Results   Accession: [id number]  Kidney, biopsy, Left AMENDED DIAGNOSIS: - CLEAR CELL RENAL CELL CARCINOMA - SEE COMMENT.     REVIEW OF SYSTEMS:   Constitutional: ( - ) fevers, ( - )  chills , ( - ) night sweats Eyes: ( - ) blurriness of vision, ( - ) double vision, ( - ) watery eyes Ears, nose, mouth, throat, and face: ( - ) mucositis, ( - ) sore throat Respiratory: ( - ) cough, ( - ) dyspnea, ( - ) wheezes Cardiovascular: ( - ) palpitation, ( - ) chest discomfort, ( - ) lower extremity swelling Gastrointestinal:  ( - ) nausea, ( - ) heartburn, ( - ) change in bowel habits Skin: ( - ) abnormal skin rashes Lymphatics: ( - ) new lymphadenopathy, ( - ) easy bruising Neurological: ( - ) numbness, ( - ) tingling, ( - ) new weaknesses Behavioral/Psych: ( - ) mood change, ( - ) new changes  All other systems were reviewed with the patient and are negative.  I have reviewed the past medical history, past surgical history, social history and family history with the patient and they are unchanged from previous note.  ALLERGIES:  has No Known Allergies.  MEDICATIONS:  Current Outpatient Medications  Medication Sig Dispense Refill  . diphenhydramine-acetaminophen (TYLENOL PM) 25-500 MG TABS tablet Take 1 tablet by mouth at bedtime.    . docusate sodium (COLACE) 100 MG capsule Take 100 mg by mouth 3 (three) times daily as needed for mild constipation.    . Omega-3 Fatty Acids (FISH OIL) 1000 MG CAPS Take 1,000 mg by mouth daily.    . pantoprazole (PROTONIX) 40 MG tablet Take 1 tablet (40 mg total) by mouth daily. (Patient not taking:  Reported on 10/09/2018) 30 tablet 0  . traMADol (ULTRAM) 50 MG tablet Take 1 tablet (50 mg total) by mouth every 8 (eight) hours as needed. (Patient not taking: Reported on 10/09/2018) 30 tablet 1   No current facility-administered medications for this visit.     PHYSICAL EXAMINATION: ECOG PERFORMANCE STATUS: 2 - Symptomatic, <50% confined to bed  Today's Vitals   10/18/18 1328  BP: (!) 154/51  Pulse: (!) 45  Resp: 18  Temp: (!) 97.5 F (36.4 C)  TempSrc: Temporal  SpO2: 99%  Weight: 118 lb 6.4 oz (53.7 kg)  Height: 5' 6"  (1.676 m)  PainSc: 0-No pain   Body mass index is 19.11 kg/m.  Filed Weights   10/18/18 1328  Weight: 118 lb 6.4 oz (53.7 kg)    GENERAL: alert, no distress and comfortable, thin, slightly frail appearing  SKIN: skin color, texture, turgor are normal, no rashes or significant lesions EYES: conjunctiva are pink and non-injected, sclera clear OROPHARYNX: no exudate, no erythema; lips, buccal mucosa, and tongue normal  NECK: supple, non-tender LYMPH:  no palpable  lymphadenopathy in the cervical LUNGS: clear to auscultation with normal breathing effort HEART: regular rate & rhythm and no murmurs and no lower extremity edema ABDOMEN: soft, non-tender, non-distended, normal bowel sounds Musculoskeletal: no cyanosis of digits and no clubbing  PSYCH: alert & oriented x 3, slightly slowed speech  LABORATORY DATA:  I have reviewed the data as listed    Component Value Date/Time   NA 136 09/24/2018 1259   K 4.4 09/24/2018 1259   CL 100 09/24/2018 1259   CO2 28 09/24/2018 1259   GLUCOSE 108 (H) 09/24/2018 1259   BUN 22 09/24/2018 1259   CREATININE 1.01 09/24/2018 1259   CREATININE 0.91 09/24/2012 0924   CALCIUM 9.4 09/24/2018 1259   PROT 7.2 09/24/2018 1259   ALBUMIN 3.4 (L) 09/24/2018 1259   AST 13 (L) 09/24/2018 1259   ALT 14 09/24/2018 1259   ALKPHOS 75 09/24/2018 1259   BILITOT 0.8 09/24/2018 1259   GFRNONAA >60 09/24/2018 1259   GFRAA >60 09/24/2018  1259    No results found for: SPEP, UPEP  Lab Results  Component Value Date   WBC 7.7 10/14/2018   NEUTROABS 5.6 09/24/2018   HGB 17.7 (H) 10/14/2018   HCT 61.3 (H) 10/14/2018   MCV 74.4 (L) 10/14/2018   PLT 188 10/14/2018      Chemistry      Component Value Date/Time   NA 136 09/24/2018 1259   K 4.4 09/24/2018 1259   CL 100 09/24/2018 1259   CO2 28 09/24/2018 1259   BUN 22 09/24/2018 1259   CREATININE 1.01 09/24/2018 1259   CREATININE 0.91 09/24/2012 0924      Component Value Date/Time   CALCIUM 9.4 09/24/2018 1259   ALKPHOS 75 09/24/2018 1259   AST 13 (L) 09/24/2018 1259   ALT 14 09/24/2018 1259   BILITOT 0.8 09/24/2018 1259       RADIOGRAPHIC STUDIES: I have personally reviewed the radiological images as listed below and agreed with the findings in the report. Ct Chest W Contrast  Result Date: 10/01/2018 CLINICAL DATA:  75 ml omni 300 Renal mass Pt.denies any chest complaints Bun 22 cr 1.0 09-24-2018^43m OMNIPAQUE IOHEXOL 300 MG/ML SOLN EXAM: CT CHEST WITH CONTRAST TECHNIQUE: Multidetector CT imaging of the chest was performed during intravenous contrast administration. CONTRAST:  787mOMNIPAQUE IOHEXOL 300 MG/ML  SOLN COMPARISON:  CT of the abdomen and pelvis on 09/16/2018 FINDINGS: Cardiovascular: Heart size is normal. No pericardial effusion. There are coronary artery calcifications. There is atherosclerotic calcification of the thoracic aorta. There is aneurysmal dilatation of the aorta at the level of the diaphragmatic hiatus, stable in appearance and measuring up to 3.8 centimeters. Focal dissection in this segment is unchanged. Mediastinum/Nodes: The visualized portion of the thyroid gland has a normal appearance. Lungs/Pleura: There are numerous masses throughout the lungs bilaterally. The largest represents a confluent mass in the posterior RIGHT LOWER lobe, measuring 2.6 x 2.4 x 1.6 centimeters on image. Second nodule in the RIGHT LOWER lobe is 1.7 x 1.3  centimeters on image. Nodules measuring on the order of 1 centimeter are identified throughout the UPPER and LOWER lobes bilaterally. Rare calcified nodules are present. There is septal thickening and mild, diffuse dependent ground-glass opacity, consistent with mild edema. No pleural effusions. No consolidations. Upper Abdomen: Heterogeneous solid mass again identified within the LEFT kidney, partially imaged. RIGHT renal cyst is present. Gallbladder is present. Musculoskeletal: Moderate degenerative changes are identified in the thoracic spine. Small lucent lesion is identified in the posterior  body of T7. Small lucent lesion is identified within the inferior vertebral body of T10. There is mild anterior wedge deformity of T10, consistent with chronic compression fracture. IMPRESSION: 1. Numerous bilateral pulmonary nodules, consistent with metastatic disease. 2. Lung parenchymal changes consistent with mild edema. 3. Stable appearance of aneurysmal dilatation and short segment dissection of the aorta at the level of the diaphragmatic hiatus. 4. Coronary artery disease. 5. Possible small osseous metastases within the vertebral bodies of T7 and T10. 6. Aortic Atherosclerosis (ICD10-I70.0). 7.  Aortic aneurysm NOS (ICD10-I71.9). Electronically Signed   By: Nolon Nations M.D.   On: 10/01/2018 08:30   Mr Jeri Cos JS Contrast  Result Date: 09/30/2018 CLINICAL DATA:  Suspected metastatic renal cell carcinoma. EXAM: MRI HEAD WITHOUT AND WITH CONTRAST TECHNIQUE: Multiplanar, multiecho pulse sequences of the brain and surrounding structures were obtained without and with intravenous contrast. CONTRAST:  6 mL Gadavist COMPARISON:  Head CT 06/05/2013 FINDINGS: An axial FLAIR sequence was inadvertently not performed. Brain: There is no evidence of acute infarct, intracranial hemorrhage, mass, midline shift, or extra-axial fluid collection. Patchy T2 hyperintensities in the cerebral white matter nonspecific but  compatible with moderate chronic small vessel ischemic disease. There is moderate cerebral atrophy. No abnormal enhancement is identified. Vascular: Major intracranial vascular flow voids are preserved. Skull and upper cervical spine: Unremarkable bone marrow signal. Sinuses/Orbits: Unremarkable orbits. Paranasal sinuses and mastoid air cells are clear. Other: None. IMPRESSION: 1. No evidence of intracranial metastases. 2. Moderate chronic small vessel ischemic disease and cerebral atrophy. Electronically Signed   By: Logan Bores M.D.   On: 09/30/2018 21:49   Ct Biopsy  Result Date: 10/14/2018 INDICATION: 85 year old with a large left renal mass and pulmonary nodules. Tissue diagnosis is needed. EXAM: CT-GUIDED LEFT RENAL MASS BIOPSY MEDICATIONS: Moderate sedation ANESTHESIA/SEDATION: Moderate (conscious) sedation was employed during this procedure. A total of Versed 1.0 mg and Fentanyl 25 mcg was administered intravenously. Moderate Sedation Time: 32 minutes. The patient's level of consciousness and vital signs were monitored continuously by radiology nursing throughout the procedure under my direct supervision. FLUOROSCOPY TIME:  None COMPLICATIONS: None immediate. PROCEDURE: Informed written consent was obtained from the patient after a thorough discussion of the procedural risks, benefits and alternatives. All questions were addressed. A timeout was performed prior to the initiation of the procedure. Patient was placed prone and CT images of the abdomen were obtained. The large left renal mass was identified. Left renal mass was also identified with ultrasound. The left flank was prepped with chlorhexidine and sterile field was created. Skin and soft tissues were anesthetized with 1% lidocaine. 17 gauge coaxial needle was directed along the medial aspect of the lesion with CT guidance. Needle position confirmed within the lesion with CT and ultrasound. 2 core biopsies were obtained with an 18 gauge core  device and placed in formalin. Significant bleeding through the coaxial needle when the inner stylet was removed. Gel-Foam slurry was injected through the 17 gauge needle as it was removed. Follow up CT images were obtained. Bandage placed over the puncture site. FINDINGS: Large irregular mass involving the mid and upper pole of the left kidney. The medial mid/upper pole region was targeted with CT and ultrasound guidance. Needle position confirmed within the lesion. Significant bleeding through the 17 gauge needle when the inner stylet was removed. Two core biopsies were obtained. Additional core biopsies were not obtained due to the bleeding. Small perinephric hematoma developed immediately following placement of the 17 gauge needle. Perinephric hematoma  do NOT significantly enlarge during the procedure. IMPRESSION: CT-guided core biopsies of the left renal mass. Electronically Signed   By: Markus Daft M.D.   On: 10/14/2018 10:30

## 2018-10-18 NOTE — Progress Notes (Signed)
Received a call prior to today's appointment from patient daughter. She'd like an idea of what will be discussed at today appointment. Gave her an idea of what to expect and answered some questions. She also questioned whether labs were needed today since patient had them drawn on Monday. Spoke to Dr Zhao and okay to cancel lab appointment.  Met with patient and daughter when here for appointment. He is doing well. No pain. Biopsy site is unremarkable. He states he has had some increased appetite and we discussed ways to help him try to increase calories to prevent further weight loss, and potentially encourage some weight gain.   Daughter still hasn't heard from the home health agency. She asks that for now I hold on reaching out, as she wants to hear what the doctor says and talk to her brother so they can come up with a plan that works for everyone. 

## 2018-10-21 ENCOUNTER — Encounter: Payer: Self-pay | Admitting: *Deleted

## 2018-10-21 NOTE — Progress Notes (Signed)
Received call from patient's daughter. The patient and family are weighing options regarding whether or not to treat. Daughter had several more questions which were answered to her satisfaction.   She would like to know if she, her brother and sister can talk with Dr Maylon Peppers without the patient. She is okay to make an appointment or it be over the phone. Spoke with Dr Maylon Peppers and he is okay with calling the family.   Derrick Schultz is aware of Dr Lorette Ang agreement. She will talk with her siblings and let me know how they choose to move forward.

## 2018-10-24 ENCOUNTER — Encounter: Payer: Self-pay | Admitting: *Deleted

## 2018-10-24 NOTE — Progress Notes (Signed)
Patient's daughter, Truman Hayward, called stating that she was unable to speak with home health last week because of being overwhelmed with appointments and needed them to reach out to set up services. Called Chelsea at [facility] and she will reach out to her this week to initiate service.  Truman Hayward also wanted to know if patient's swallow study can be scheduled at this locations radiology department. Called downstairs and they are unable to provide that test at this location. Daughter notified that she will need to take him to [facility] or [facility]. She understood.

## 2018-10-30 ENCOUNTER — Encounter: Payer: Self-pay | Admitting: *Deleted

## 2018-10-30 NOTE — Progress Notes (Signed)
Received a call from [place] at [facility]. The patient's family has reached out and would like to admit patient. Manus Gunning would like to know if Dr Maylon Peppers would support this, and remain attending for patient.   Spoke with Dr Maylon Peppers and is agreeable to patient pursuing hospice. Manus Gunning notified. They will proceed with evaluation.

## 2018-11-04 ENCOUNTER — Encounter: Payer: Self-pay | Admitting: *Deleted

## 2018-12-05 DEATH — deceased

## 2020-04-18 IMAGING — CT CT ABDOMEN AND PELVIS WITH CONTRAST
2 of 5 series · 15 of 46 positions shown, 17 images · IV contrast (APPLIED)
Comparison: 05/23/2018

CLINICAL DATA: Constipation, weight loss, kidney mass, history of
stones, hernia repair

EXAM:
CT ABDOMEN AND PELVIS WITH CONTRAST
TECHNIQUE: Multidetector CT imaging of the abdomen and pelvis was performed
using the standard protocol following bolus administration of
intravenous contrast.
CONTRAST:  100mL OMNIPAQUE IOHEXOL 300 MG/ML SOLN, additional oral
enteric contrast

[Series 2: axial st · axial · 0.72mm/px · z∈[-530,-76]mm · 12 of 103 slices shown, 14 images]
[im 6/103  soft-tissue]
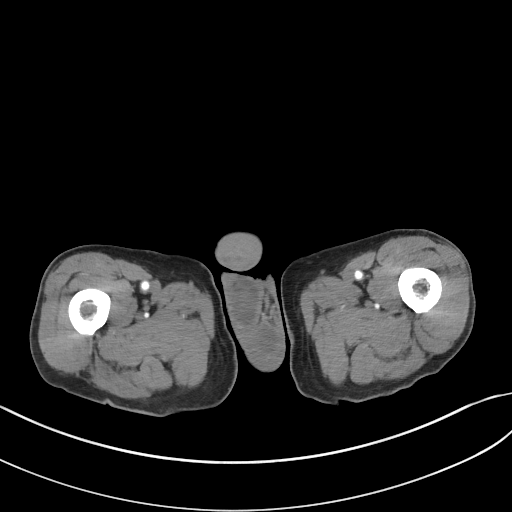
[im 6/103  bone]
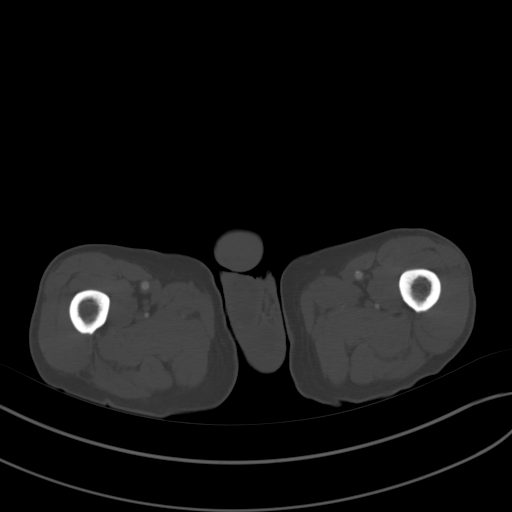
[im 17/103  soft-tissue]
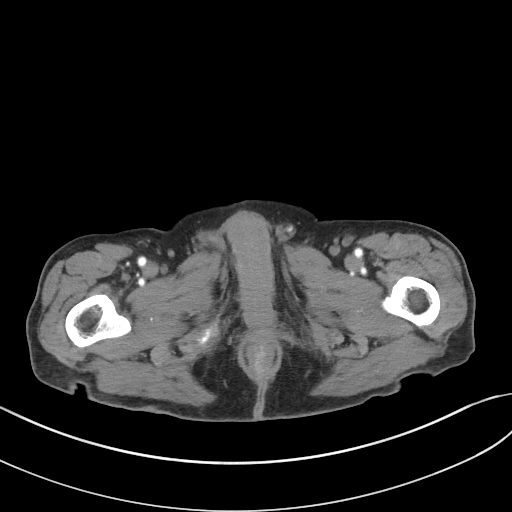
[im 22/103  soft-tissue]
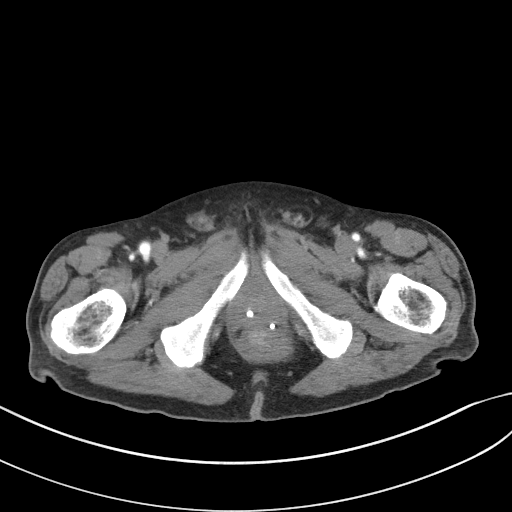
[im 33/103  soft-tissue]
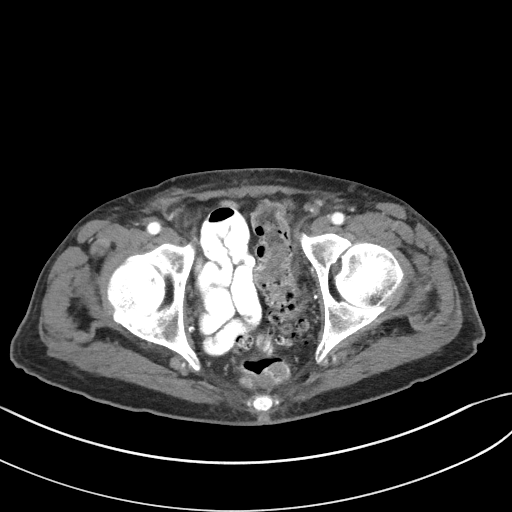
[im 38/103  soft-tissue]
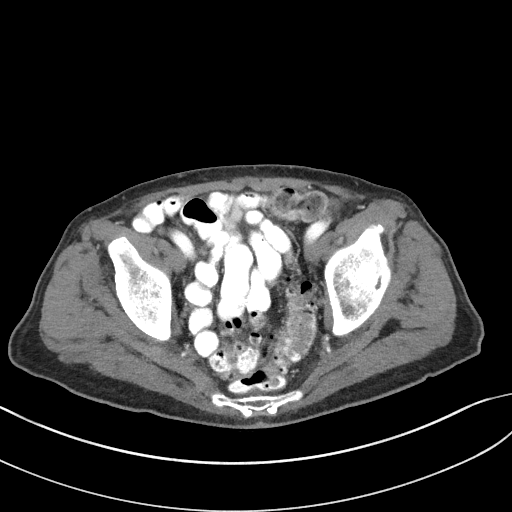
[im 49/103  soft-tissue]
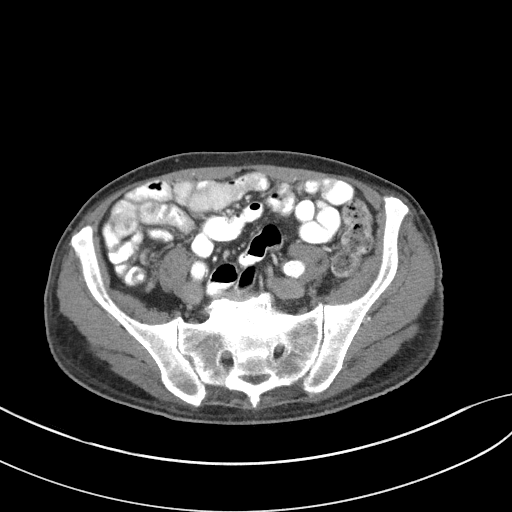
[im 54/103  soft-tissue]
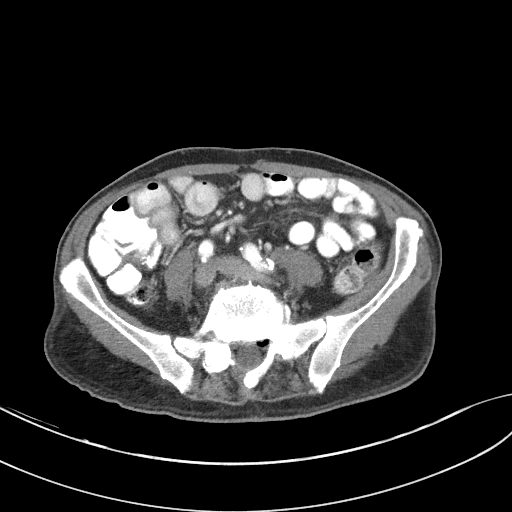
[im 65/103  soft-tissue]
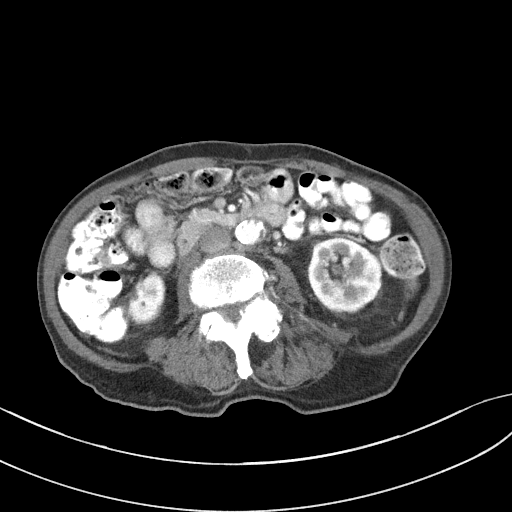
[im 70/103  soft-tissue]
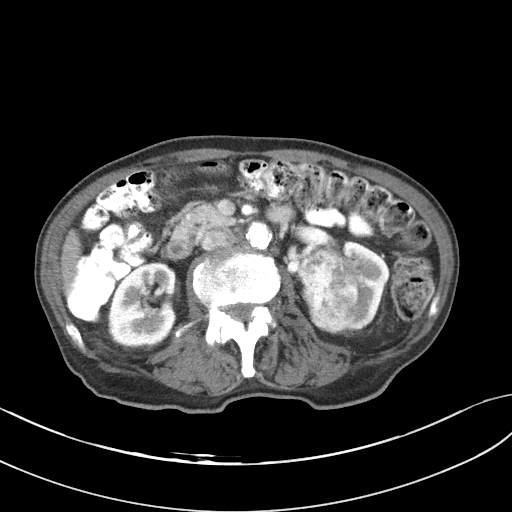
[im 70/103  bone]
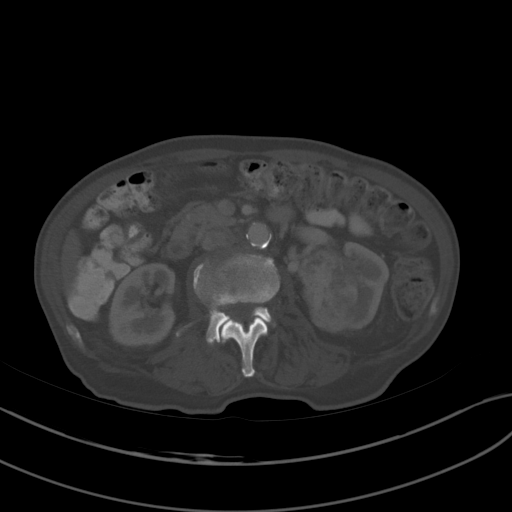
[im 81/103  soft-tissue]
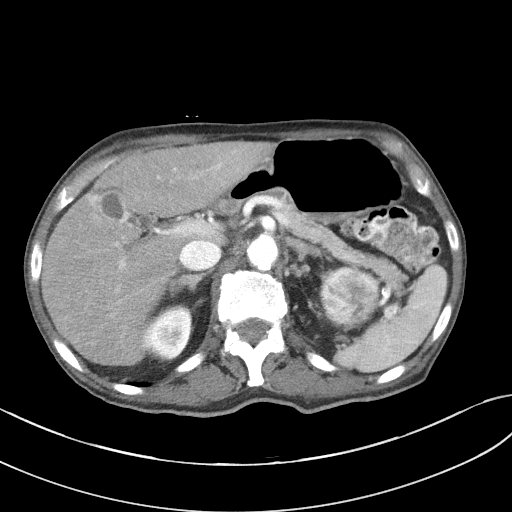
[im 86/103  soft-tissue]
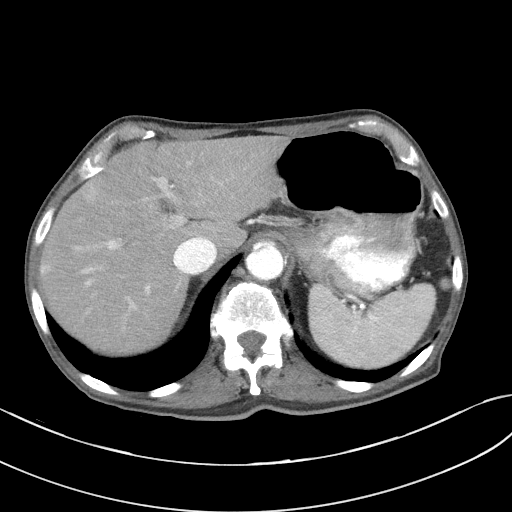
[im 97/103  soft-tissue]
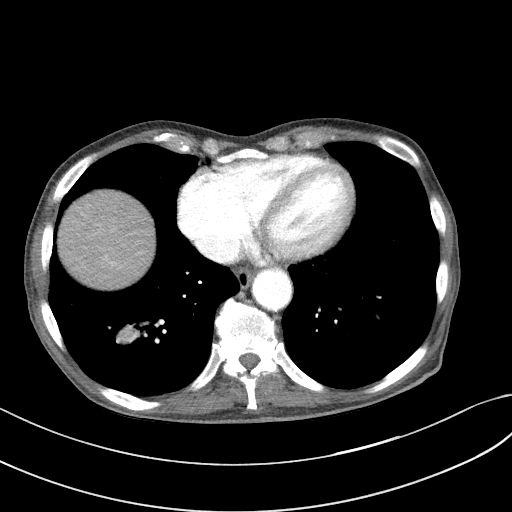

[Series 4: coronal st · coronal · 0.81mm/px · 3 of 73 slices shown]
[im 25/73  soft-tissue]
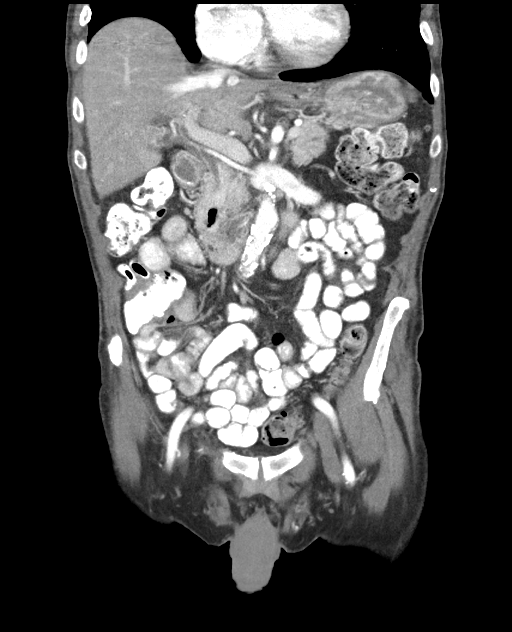
[im 33/73  soft-tissue]
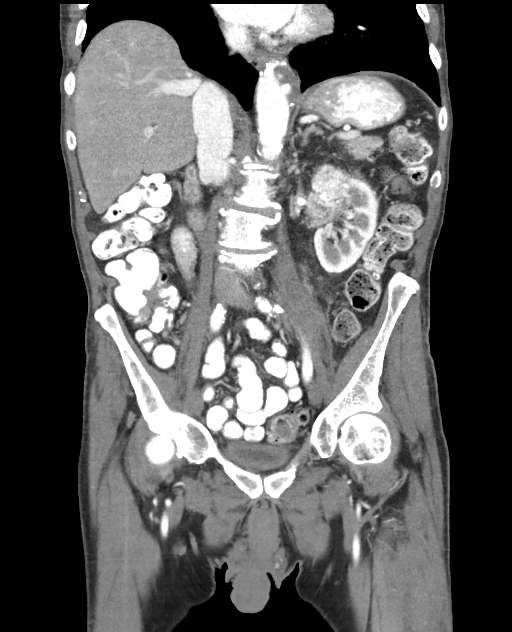
[im 41/73  soft-tissue]
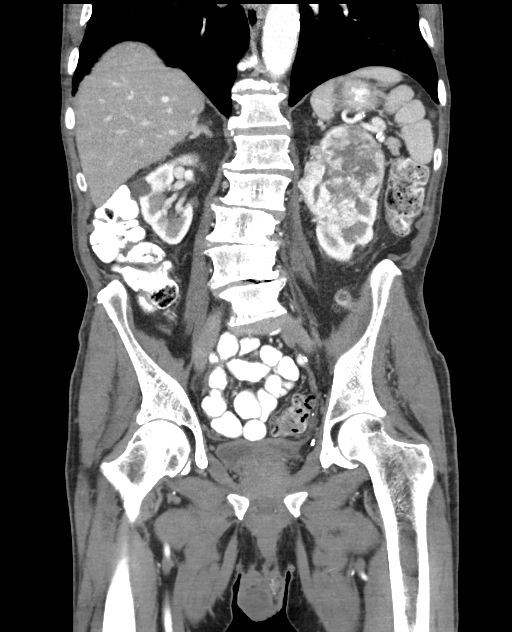

[15 of 46 positions shown; findings below may reference images not displayed]

FINDINGS: Lower chest: No acute abnormality. Multiple new nodules of the
included bilateral lung bases, the largest measuring 2.0 cm in the
right lower lobe (series 6, image 8).

Hepatobiliary: Hepatic steatosis. Flash filling subcapsular
hemangioma of the right lobe of the liver unchanged compared to
prior. No gallstones, gallbladder wall thickening, or biliary
dilatation.

Pancreas: Unremarkable. No pancreatic ductal dilatation or
surrounding inflammatory changes.

Spleen: Normal in size without significant abnormality.

Adrenals/Urinary Tract: Adrenal glands are unremarkable. There is a
redemonstrated large, heterogeneously hypoenhancing mass of the
superior pole and midportion of the left kidney measuring
approximately 8.2 x 6.1 x 5.6 cm, perhaps minimally enlarged
compared to prior examination at which time it measured 8.1 x 5.2 x
5.4 cm when measured similar (series 2, image 32, series 4, image
43). This may additionally involve the left renal vein. Bladder is
unremarkable.

Stomach/Bowel: Stomach is within normal limits. Appendix appears
normal. No evidence of bowel wall thickening, distention, or
inflammatory changes. Sigmoid diverticulosis.

Vascular/Lymphatic: Severe aortic atherosclerosis, with unchanged
focal dissection and aneurysm near the diaphragmatic hiatus
measuring up to 3.8 x 2.9 cm. No enlarged abdominal or pelvic lymph
nodes.

Reproductive: Prostatomegaly.

Other: No abdominal wall hernia or abnormality. No abdominopelvic
ascites.

Musculoskeletal: Severe multilevel disc degenerative disease of the
lumbar spine with anterolisthesis of L4 on L5
IMPRESSION: 1. There is a redemonstrated large, heterogeneously hypoenhancing
mass of the superior pole and midportion of the left kidney
measuring approximately 8.2 x 6.1 x 5.6 cm, perhaps minimally
enlarged compared to prior examination at which time it measured
x 5.2 x 5.4 cm when measured similar (series 2, image 32, series 4,
image 43). This remains consistent with renal cell carcinoma and may
additionally involve the left renal vein.

2. Multiple new nodules of the included bilateral lung bases, the
largest measuring 2.0 cm in the right lower lobe (series 6, image
8), consistent with new pulmonary metastatic disease.

3.  Sigmoid diverticulosis.  Moderate burden of stool in the colon.

4.  Other chronic and incidental findings as detailed above.
